# Patient Record
Sex: Male | Born: 1960
Health system: Southern US, Community
[De-identification: ages and names within clinical notes are randomized; demographics above are authoritative.]

## PROBLEM LIST (undated history)

## (undated) DIAGNOSIS — T7840XA Allergy, unspecified, initial encounter: Secondary | ICD-10-CM

## (undated) DIAGNOSIS — Z86010 Personal history of colonic polyps: Secondary | ICD-10-CM

## (undated) DIAGNOSIS — L719 Rosacea, unspecified: Secondary | ICD-10-CM

## (undated) HISTORY — DX: Personal history of colonic polyps: Z86.010

## (undated) HISTORY — DX: Rosacea, unspecified: L71.9

## (undated) HISTORY — PX: KNEE ARTHROSCOPY: SUR90

## (undated) HISTORY — DX: Allergy, unspecified, initial encounter: T78.40XA

## (undated) HISTORY — PX: COLONOSCOPY: SHX174

---

## 2006-07-29 ENCOUNTER — Ambulatory Visit: Payer: Self-pay | Admitting: Internal Medicine

## 2006-07-29 LAB — CONVERTED CEMR LAB
ALT: 38 units/L (ref 0–40)
AST: 28 units/L (ref 0–37)
Albumin: 4.2 g/dL (ref 3.5–5.2)
Alkaline Phosphatase: 51 units/L (ref 39–117)
BUN: 11 mg/dL (ref 6–23)
Basophils Absolute: 0 10*3/uL (ref 0.0–0.1)
Basophils Relative: 0.4 % (ref 0.0–1.0)
CO2: 33 meq/L — ABNORMAL HIGH (ref 19–32)
Calcium: 9.4 mg/dL (ref 8.4–10.5)
Chloride: 101 meq/L (ref 96–112)
Chol/HDL Ratio, serum: 3.5
Cholesterol: 148 mg/dL (ref 0–200)
Creatinine, Ser: 1.4 mg/dL (ref 0.4–1.5)
Eosinophil percent: 4.8 % (ref 0.0–5.0)
GFR calc non Af Amer: 59 mL/min
Glomerular Filtration Rate, Af Am: 71 mL/min/{1.73_m2}
Glucose, Bld: 84 mg/dL (ref 70–99)
HCT: 46 % (ref 39.0–52.0)
HDL: 42.2 mg/dL (ref >39.0)
Hemoglobin: 15.5 g/dL (ref 13.0–17.0)
LDL Cholesterol: 96 mg/dL (ref 0–99)
Lymphocytes Relative: 26.2 % (ref 12.0–46.0)
MCHC: 33.7 g/dL (ref 30.0–36.0)
MCV: 94.1 fL (ref 78.0–100.0)
Monocytes Absolute: 0.4 10*3/uL (ref 0.2–0.7)
Monocytes Relative: 10.4 % (ref 3.0–11.0)
Neutro Abs: 1.9 10*3/uL (ref 1.4–7.7)
Neutrophils Relative %: 58.2 % (ref 43.0–77.0)
Platelets: 124 10*3/uL — ABNORMAL LOW (ref 150–400)
Potassium: 3.9 meq/L (ref 3.5–5.1)
RBC: 4.89 M/uL (ref 4.22–5.81)
RDW: 12.6 % (ref 11.5–14.6)
Sodium: 140 meq/L (ref 135–145)
TSH: 2.05 microintl units/mL (ref 0.35–5.50)
Total Bilirubin: 1.3 mg/dL — ABNORMAL HIGH (ref 0.3–1.2)
Total Protein: 6.4 g/dL (ref 6.0–8.3)
Triglyceride fasting, serum: 48 mg/dL (ref 0–149)
VLDL: 10 mg/dL (ref 0–40)
WBC: 3.4 10*3/uL — ABNORMAL LOW (ref 4.5–10.5)

## 2006-08-03 ENCOUNTER — Ambulatory Visit: Payer: Self-pay | Admitting: Internal Medicine

## 2006-08-03 DIAGNOSIS — L259 Unspecified contact dermatitis, unspecified cause: Secondary | ICD-10-CM | POA: Insufficient documentation

## 2006-09-23 ENCOUNTER — Ambulatory Visit: Payer: Self-pay | Admitting: Internal Medicine

## 2006-11-03 ENCOUNTER — Ambulatory Visit: Payer: Self-pay | Admitting: Internal Medicine

## 2006-11-03 LAB — CONVERTED CEMR LAB
Basophils Absolute: 0 10*3/uL (ref 0.0–0.1)
Basophils Relative: 0.3 % (ref 0.0–1.0)
Eosinophils Absolute: 0.2 10*3/uL (ref 0.0–0.6)
Eosinophils Relative: 5.1 % — ABNORMAL HIGH (ref 0.0–5.0)
HCT: 43.4 % (ref 39.0–52.0)
Hemoglobin: 15.3 g/dL (ref 13.0–17.0)
Lymphocytes Relative: 25.8 % (ref 12.0–46.0)
MCHC: 35.2 g/dL (ref 30.0–36.0)
MCV: 93 fL (ref 78.0–100.0)
Monocytes Absolute: 0.4 10*3/uL (ref 0.2–0.7)
Monocytes Relative: 11.8 % — ABNORMAL HIGH (ref 3.0–11.0)
Neutro Abs: 2.1 10*3/uL (ref 1.4–7.7)
Neutrophils Relative %: 57 % (ref 43.0–77.0)
Platelets: 136 10*3/uL — ABNORMAL LOW (ref 150–400)
RBC: 4.66 M/uL (ref 4.22–5.81)
RDW: 13.1 % (ref 11.5–14.6)
WBC: 3.7 10*3/uL — ABNORMAL LOW (ref 4.5–10.5)

## 2006-12-05 ENCOUNTER — Emergency Department (HOSPITAL_COMMUNITY): Admission: EM | Admit: 2006-12-05 | Discharge: 2006-12-06 | Payer: Self-pay | Admitting: Emergency Medicine

## 2006-12-06 ENCOUNTER — Ambulatory Visit: Payer: Self-pay | Admitting: Vascular Surgery

## 2006-12-06 ENCOUNTER — Encounter: Payer: Self-pay | Admitting: Vascular Surgery

## 2006-12-06 ENCOUNTER — Ambulatory Visit (HOSPITAL_COMMUNITY): Admission: RE | Admit: 2006-12-06 | Discharge: 2006-12-06 | Payer: Self-pay | Admitting: Emergency Medicine

## 2007-05-26 DIAGNOSIS — J309 Allergic rhinitis, unspecified: Secondary | ICD-10-CM | POA: Insufficient documentation

## 2007-05-31 ENCOUNTER — Ambulatory Visit: Payer: Self-pay | Admitting: Internal Medicine

## 2007-06-01 ENCOUNTER — Telehealth (INDEPENDENT_AMBULATORY_CARE_PROVIDER_SITE_OTHER): Payer: Self-pay | Admitting: *Deleted

## 2007-06-06 ENCOUNTER — Encounter: Payer: Self-pay | Admitting: Internal Medicine

## 2007-10-31 ENCOUNTER — Encounter: Payer: Self-pay | Admitting: Internal Medicine

## 2007-11-02 ENCOUNTER — Ambulatory Visit: Payer: Self-pay | Admitting: Internal Medicine

## 2007-11-02 DIAGNOSIS — L719 Rosacea, unspecified: Secondary | ICD-10-CM | POA: Insufficient documentation

## 2008-11-09 ENCOUNTER — Telehealth: Payer: Self-pay | Admitting: Internal Medicine

## 2009-04-11 ENCOUNTER — Telehealth: Payer: Self-pay | Admitting: Internal Medicine

## 2009-04-18 ENCOUNTER — Telehealth: Payer: Self-pay | Admitting: Internal Medicine

## 2009-05-03 ENCOUNTER — Ambulatory Visit: Payer: Self-pay | Admitting: Internal Medicine

## 2009-05-03 LAB — CONVERTED CEMR LAB
AST: 22 units/L (ref 0–37)
Albumin: 4 g/dL (ref 3.5–5.2)
Alkaline Phosphatase: 54 units/L (ref 39–117)
BUN: 11 mg/dL (ref 6–23)
Basophils Absolute: 0 10*3/uL (ref 0.0–0.1)
Basophils Relative: 0.1 % (ref 0.0–3.0)
Bilirubin, Direct: 0.2 mg/dL (ref 0.0–0.3)
CO2: 32 meq/L (ref 19–32)
Chloride: 104 meq/L (ref 96–112)
Creatinine, Ser: 1.1 mg/dL (ref 0.4–1.5)
Eosinophils Absolute: 0.2 10*3/uL (ref 0.0–0.7)
Glucose, Bld: 100 mg/dL — ABNORMAL HIGH (ref 70–99)
Hemoglobin: 16.2 g/dL (ref 13.0–17.0)
Ketones, ur: NEGATIVE mg/dL
LDL Cholesterol: 85 mg/dL (ref 0–99)
Lymphocytes Relative: 15.1 % (ref 12.0–46.0)
Monocytes Relative: 11.2 % (ref 3.0–12.0)
Neutro Abs: 3.7 10*3/uL (ref 1.4–7.7)
Neutrophils Relative %: 70.4 % (ref 43.0–77.0)
Potassium: 4.4 meq/L (ref 3.5–5.1)
RBC: 4.92 M/uL (ref 4.22–5.81)
Specific Gravity, Urine: 1.015 (ref 1.000–1.030)
Total CHOL/HDL Ratio: 3
Total Protein, Urine: NEGATIVE mg/dL
Total Protein: 7 g/dL (ref 6.0–8.3)
Urine Glucose: NEGATIVE mg/dL
Urobilinogen, UA: 0.2 (ref 0.0–1.0)
VLDL: 10.2 mg/dL (ref 0.0–40.0)

## 2009-05-16 ENCOUNTER — Ambulatory Visit: Payer: Self-pay | Admitting: Internal Medicine

## 2010-06-11 ENCOUNTER — Ambulatory Visit: Payer: Self-pay | Admitting: Internal Medicine

## 2010-06-11 LAB — CONVERTED CEMR LAB
AST: 22 units/L (ref 0–37)
Albumin: 4 g/dL (ref 3.5–5.2)
BUN: 14 mg/dL (ref 6–23)
Basophils Absolute: 0 10*3/uL (ref 0.0–0.1)
Bilirubin Urine: NEGATIVE
CO2: 33 meq/L — ABNORMAL HIGH (ref 19–32)
Chloride: 104 meq/L (ref 96–112)
Cholesterol: 141 mg/dL (ref 0–200)
Eosinophils Absolute: 0.1 10*3/uL (ref 0.0–0.7)
Glucose, Bld: 77 mg/dL (ref 70–99)
HCT: 43.1 % (ref 39.0–52.0)
HDL: 40.7 mg/dL (ref >39.00)
Hemoglobin: 15 g/dL (ref 13.0–17.0)
Lymphs Abs: 0.8 10*3/uL (ref 0.7–4.0)
MCHC: 34.8 g/dL (ref 30.0–36.0)
MCV: 95.3 fL (ref 78.0–100.0)
Monocytes Absolute: 0.3 10*3/uL (ref 0.1–1.0)
Monocytes Relative: 9.6 % (ref 3.0–12.0)
Neutro Abs: 1.9 10*3/uL (ref 1.4–7.7)
Nitrite: NEGATIVE
Platelets: 109 10*3/uL — ABNORMAL LOW (ref 150.0–400.0)
Potassium: 4.4 meq/L (ref 3.5–5.1)
RDW: 13.5 % (ref 11.5–14.6)
Sodium: 141 meq/L (ref 135–145)
TSH: 2.34 microintl units/mL (ref 0.35–5.50)
Total Bilirubin: 0.7 mg/dL (ref 0.3–1.2)
Total Protein, Urine: NEGATIVE mg/dL
Urine Glucose: NEGATIVE mg/dL
pH: 6 (ref 5.0–8.0)

## 2010-06-18 ENCOUNTER — Ambulatory Visit: Payer: Self-pay | Admitting: Internal Medicine

## 2010-06-18 DIAGNOSIS — D696 Thrombocytopenia, unspecified: Secondary | ICD-10-CM | POA: Insufficient documentation

## 2010-06-18 DIAGNOSIS — D72819 Decreased white blood cell count, unspecified: Secondary | ICD-10-CM | POA: Insufficient documentation

## 2010-06-18 HISTORY — DX: Thrombocytopenia, unspecified: D69.6

## 2010-07-28 ENCOUNTER — Encounter: Payer: Self-pay | Admitting: Internal Medicine

## 2010-09-23 NOTE — Assessment & Plan Note (Signed)
Summary: CPX // RS   Vital Signs:  Patient profile:   50 year old male Height:      70.5 inches Weight:      177 pounds BMI:     25.13 Temp:     97.8 degrees F oral Pulse rate:   64 / minute Pulse rhythm:   regular BP sitting:   116 / 80  (right arm) Cuff size:   regular  Vitals Entered By: Alfred Levins, CMA (June 18, 2010 8:18 AM) CC: cpx   CC:  cpx.  History of Present Illness: cpx  Current Problems (verified): 1)  Preventive Health Care  (ICD-V70.0) 2)  Rosacea  (ICD-695.3) 3)  Allergic Rhinitis  (ICD-477.9) 4)  Dermatitis, Scalp  (ICD-692.9)  Current Medications (verified): 1)  Fexofenadine Hcl 180 Mg  Tabs (Fexofenadine Hcl) .... Take 1 Tablet By Mouth Once A Day 2)  Bi-Focal Magnifier   Misc (Misc. Devices) 3)  Fluticasone Propionate 50 Mcg/act  Susp (Fluticasone Propionate) .... Once Daily As Needed 4)  Zolpidem Tartrate 5 Mg Tabs (Zolpidem Tartrate) .... Take 1 Tablet By Mouth At Bedtime As Needed --Not To Exceed Two Per Week.  Allergies (verified): No Known Drug Allergies  Past History:  Past Medical History: Last updated: 05/26/2007 Allergic rhinitis  Past Surgical History: Last updated: 08/03/2006 knee arthroscopy  Family History: Last updated: 08/03/2006 father aortic valve disease--replaced mother alive and well  Social History: Last updated: 08/03/2006 Occupation: [employer] Married Never Smoked Alcohol use-yes Regular exercise-yes soccer  Risk Factors: Exercise: yes (08/03/2006)  Risk Factors: Smoking Status: never (08/03/2006)  Physical Exam  Nose:  no external erythema.     Impression & Recommendations:  Problem # 1:  PREVENTIVE HEALTH CARE (ICD-V70.0) health maint UTD continue good exercise habits.   Problem # 2:  ALLERGIC RHINITIS (ICD-477.9) doing well on current meds His updated medication list for this problem includes:    Fexofenadine Hcl 180 Mg Tabs (Fexofenadine hcl) .[name] Kitchen... Take 1 tablet by mouth once a day  Fluticasone Propionate 50 Mcg/act Susp (Fluticasone propionate) ..... Once daily as needed  Problem # 3:  LEUKOPENIA, MILD (ICD-288.50) chronic. no need for workup  Problem # 4:  THROMBOCYTOPENIA (ICD-287.5)  Complete Medication List: 1)  Fexofenadine Hcl 180 Mg Tabs (Fexofenadine hcl) .... Take 1 tablet by mouth once a day 2)  Bi-focal Magnifier Misc (Misc. devices) 3)  Fluticasone Propionate 50 Mcg/act Susp (Fluticasone propionate) .... Once daily as needed 4)  Zolpidem Tartrate 5 Mg Tabs (Zolpidem tartrate) .... Take 1 tablet by mouth at bedtime as needed --not to exceed two per week.   Orders Added: 1)  Est. Patient 40-64 years [[id number]]   Physical Exam General Appearance: well developed, well nourished, no acute distress Eyes: conjunctiva and lids normal, PERRL, EOMI, Ears, Nose, Mouth, Throat: TM clear, nares clear, oral exam WNL Neck: supple, no lymphadenopathy, no thyromegaly, no JVD Respiratory: clear to auscultation and percussion, respiratory effort normal Cardiovascular: regular rate and rhythm, S1-S2, no murmur, rub or gallop, no bruits, peripheral pulses normal and symmetric, no cyanosis, clubbing, edema or varicosities Chest: no scars, masses, tenderness; no asymmetry, skin changes, nipple discharge, no gynecomastia   Gastrointestinal: soft, non-tender; no hepatosplenomegaly, masses; active bowel sounds all quadrants,  no masses, tenderness, hemorrhoids  Genitourinary: no hernia, or prostate enlargement Lymphatic: no cervical, axillary or inguinal adenopathy Musculoskeletal: gait normal, muscle tone and strength WNL, no joint swelling, effusions, discoloration, crepitus  Skin: clear, good turgor, color WNL, no rashes, lesions, or ulcerations Neurologic:  normal mental status, normal reflexes, normal strength, sensation, and motion Psychiatric: alert; oriented to person, place and time Other Exam:

## 2010-09-25 NOTE — Letter (Signed)
Summary: [facility]  [facility]   Imported By: Maryln Gottron 09/02/2010 09:15:38  _____________________________________________________________________  External Attachment:    Type:   Image     Comment:   External Document

## 2011-07-10 ENCOUNTER — Other Ambulatory Visit: Payer: Self-pay

## 2011-07-10 ENCOUNTER — Encounter: Payer: Self-pay | Admitting: Internal Medicine

## 2011-07-15 ENCOUNTER — Ambulatory Visit (INDEPENDENT_AMBULATORY_CARE_PROVIDER_SITE_OTHER): Payer: BC Managed Care – PPO | Admitting: Internal Medicine

## 2011-07-15 ENCOUNTER — Encounter: Payer: Self-pay | Admitting: Internal Medicine

## 2011-07-15 VITALS — BP 102/76 | HR 68 | Temp 98.2°F | Ht 69.0 in | Wt 171.0 lb

## 2011-07-15 DIAGNOSIS — Z0000 Encounter for general adult medical examination without abnormal findings: Secondary | ICD-10-CM

## 2011-07-15 LAB — LIPID PANEL
Cholesterol: 143 mg/dL (ref 0–200)
HDL: 47.3 mg/dL (ref >39.00)
LDL Cholesterol: 85 mg/dL (ref 0–99)
VLDL: 10.4 mg/dL (ref 0.0–40.0)

## 2011-07-15 LAB — CBC WITH DIFFERENTIAL/PLATELET
Basophils Relative: 0.5 % (ref 0.0–3.0)
Eosinophils Relative: 3.5 % (ref 0.0–5.0)
HCT: 46.4 % (ref 39.0–52.0)
Lymphs Abs: 1 10*3/uL (ref 0.7–4.0)
MCV: 94.9 fl (ref 78.0–100.0)
Monocytes Relative: 16.9 % — ABNORMAL HIGH (ref 3.0–12.0)
Neutrophils Relative %: 43.8 % (ref 43.0–77.0)
Platelets: 113 10*3/uL — ABNORMAL LOW (ref 150.0–400.0)
RBC: 4.89 Mil/uL (ref 4.22–5.81)
WBC: 2.7 10*3/uL — ABNORMAL LOW (ref 4.5–10.5)

## 2011-07-15 LAB — BASIC METABOLIC PANEL
CO2: 30 mEq/L (ref 19–32)
Chloride: 104 mEq/L (ref 96–112)
Creatinine, Ser: 1.1 mg/dL (ref 0.4–1.5)
Glucose, Bld: 70 mg/dL (ref 70–99)
Sodium: 141 mEq/L (ref 135–145)

## 2011-07-15 LAB — POCT URINALYSIS DIPSTICK
Glucose, UA: NEGATIVE
Leukocytes, UA: NEGATIVE
Nitrite, UA: NEGATIVE
Protein, UA: NEGATIVE
Urobilinogen, UA: 0.2

## 2011-07-15 LAB — HEPATIC FUNCTION PANEL
ALT: 21 U/L (ref 0–53)
Total Protein: 7.3 g/dL (ref 6.0–8.3)

## 2011-07-15 MED ORDER — FLUOCINOLONE ACETONIDE 0.01 % EX OIL: TOPICAL_OIL | CUTANEOUS | Status: DC | PRN

## 2011-07-15 MED ORDER — FLUTICASONE PROPIONATE 50 MCG/ACT NA SUSP: 2.0000 | Freq: Every day | NASAL | Status: DC

## 2011-07-15 MED ORDER — FEXOFENADINE HCL 180 MG PO TABS: 180.0000 mg | ORAL_TABLET | Freq: Every day | ORAL | Status: DC

## 2011-07-15 MED ORDER — ZOLPIDEM TARTRATE 5 MG PO TABS: 5.0000 mg | ORAL_TABLET | Freq: Every evening | ORAL | Status: DC | PRN

## 2011-07-15 NOTE — Progress Notes (Signed)
Addended by: Alfred Levins D on: 07/15/2011 10:25 AM   Modules accepted: Orders

## 2011-07-15 NOTE — Progress Notes (Signed)
cpx  Past Medical History  Diagnosis Date  . Allergy     History   Social History  . Marital Status: Married    Spouse Name: N/A    Number of Children: N/A  . Years of Education: N/A   Occupational History  . Not on file.   Social History Main Topics  . Smoking status: Never Smoker   . Smokeless tobacco: Not on file  . Alcohol Use: Yes  . Drug Use:   . Sexually Active:    Other Topics Concern  . Not on file   Social History Narrative  . No narrative on file    Past Surgical History  Procedure Date  . Knee arthroscopy     Family History  Problem Relation Age of Onset  . Heart disease Father     aortic vavle disease (replaced)    No Known Allergies  Current Outpatient Prescriptions on File Prior to Visit  Medication Sig Dispense Refill  . fexofenadine (ALLEGRA) 180 MG tablet Take 180 mg by mouth daily.        . fluticasone (FLONASE) 50 MCG/ACT nasal spray Place 2 sprays into the nose daily.        [name] Kitchen zolpidem (AMBIEN) 5 MG tablet Take 5 mg by mouth at bedtime as needed.           patient denies chest pain, shortness of breath, orthopnea. Denies lower extremity edema, abdominal pain, change in appetite, change in bowel movements. Patient denies rashes, musculoskeletal complaints. No other specific complaints in a complete review of systems.   BP 102/76  Pulse 68  Temp(Src) 98.2 F (36.8 C) (Oral)  Ht 5\' 9"  (1.753 m)  Wt 171 lb (77.565 kg)  BMI 25.25 kg/m2   well-developed well-nourished male in no acute distress. HEENT exam atraumatic, normocephalic, neck supple without jugular venous distention. Chest clear to auscultation cardiac exam S1-S2 are regular. Abdominal exam overweight with bowel sounds, soft and nontender. Extremities no edema. Neurologic exam is alert with a normal gait. Discussed that prostate exam not necessary   A/P Well visit-health maint UTD Well visit--health maint utd

## 2011-07-17 LAB — TSH: TSH: 0.89 u[IU]/mL (ref 0.35–5.50)

## 2011-07-27 ENCOUNTER — Telehealth: Payer: Self-pay | Admitting: Internal Medicine

## 2011-07-27 NOTE — Telephone Encounter (Signed)
Pt called and is req to get a copy of his lab results mailed to pts home address. Pt is also needing copy of doctors note from cpx exam sent to his home also. Pt needs to get proof that he had a cpx done.

## 2011-07-28 NOTE — Telephone Encounter (Signed)
Labs mailed, pt will call back to discuss office note

## 2011-08-30 ENCOUNTER — Other Ambulatory Visit: Payer: Self-pay | Admitting: Internal Medicine

## 2012-07-08 ENCOUNTER — Other Ambulatory Visit: Payer: BC Managed Care – PPO

## 2012-07-11 ENCOUNTER — Other Ambulatory Visit (INDEPENDENT_AMBULATORY_CARE_PROVIDER_SITE_OTHER): Payer: BC Managed Care – PPO

## 2012-07-11 DIAGNOSIS — Z0000 Encounter for general adult medical examination without abnormal findings: Secondary | ICD-10-CM

## 2012-07-11 LAB — HEPATIC FUNCTION PANEL
ALT: 17 U/L (ref 0–53)
AST: 24 U/L (ref 0–37)
Bilirubin, Direct: 0.2 mg/dL (ref 0.0–0.3)
Total Bilirubin: 1.1 mg/dL (ref 0.3–1.2)

## 2012-07-11 LAB — POCT URINALYSIS DIPSTICK
Bilirubin, UA: NEGATIVE
Blood, UA: NEGATIVE
pH, UA: 6

## 2012-07-11 LAB — BASIC METABOLIC PANEL
BUN: 19 mg/dL (ref 6–23)
GFR: 64.41 mL/min (ref >60.00)
Potassium: 4 mEq/L (ref 3.5–5.1)
Sodium: 139 mEq/L (ref 135–145)

## 2012-07-11 LAB — CBC WITH DIFFERENTIAL/PLATELET
Basophils Relative: 0.7 % (ref 0.0–3.0)
Eosinophils Relative: 4.8 % (ref 0.0–5.0)
HCT: 41.7 % (ref 39.0–52.0)
Lymphs Abs: 1 10*3/uL (ref 0.7–4.0)
Monocytes Relative: 10.1 % (ref 3.0–12.0)
Neutrophils Relative %: 56.1 % (ref 43.0–77.0)
Platelets: 106 10*3/uL — ABNORMAL LOW (ref 150.0–400.0)
RBC: 4.41 Mil/uL (ref 4.22–5.81)
WBC: 3.4 10*3/uL — ABNORMAL LOW (ref 4.5–10.5)

## 2012-07-11 LAB — LIPID PANEL
HDL: 46.5 mg/dL (ref >39.00)
LDL Cholesterol: 91 mg/dL (ref 0–99)
Total CHOL/HDL Ratio: 3
VLDL: 6.4 mg/dL (ref 0.0–40.0)

## 2012-07-15 ENCOUNTER — Encounter: Payer: BC Managed Care – PPO | Admitting: Internal Medicine

## 2012-07-29 ENCOUNTER — Encounter: Payer: Self-pay | Admitting: Internal Medicine

## 2012-07-29 ENCOUNTER — Ambulatory Visit (INDEPENDENT_AMBULATORY_CARE_PROVIDER_SITE_OTHER): Payer: BC Managed Care – PPO | Admitting: Internal Medicine

## 2012-07-29 VITALS — BP 102/74 | HR 68 | Temp 97.9°F | Ht 70.0 in | Wt 176.0 lb

## 2012-07-29 DIAGNOSIS — Z0000 Encounter for general adult medical examination without abnormal findings: Secondary | ICD-10-CM

## 2012-07-30 NOTE — Progress Notes (Signed)
Patient ID: Cody Knapp, male   DOB: 08/30/1960, 51 y.o.   MRN: [id number] cpx  Past Medical History  Diagnosis Date  . Allergy     History   Social History  . Marital Status: Married    Spouse Name: N/A    Number of Children: N/A  . Years of Education: N/A   Occupational History  . Not on file.   Social History Main Topics  . Smoking status: Never Smoker   . Smokeless tobacco: Not on file  . Alcohol Use: Yes  . Drug Use:   . Sexually Active:    Other Topics Concern  . Not on file   Social History Narrative  . No narrative on file    Past Surgical History  Procedure Date  . Knee arthroscopy     Family History  Problem Relation Age of Onset  . Heart disease Father     aortic vavle disease (replaced)    No Known Allergies  Current Outpatient Prescriptions on File Prior to Visit  Medication Sig Dispense Refill  . fexofenadine (ALLEGRA) 180 MG tablet Take 1 tablet (180 mg total) by mouth daily.  30 tablet  11  . fluocinolone (FLUOCINOLONE ACETONIDE SCALP) 0.01 % external oil Apply topically as needed.  120 mL  5  . fluticasone (FLONASE) 50 MCG/ACT nasal spray Place 2 sprays into the nose daily.  16 g  11  . zolpidem (AMBIEN) 5 MG tablet TAKE 1 TABLET BY MOUTH AT BEDTIME AS NEEDED. DO NOT EXCEED 2 TABLETS PER WEEK  20 tablet  2     patient denies chest pain, shortness of breath, orthopnea. Denies lower extremity edema, abdominal pain, change in appetite, change in bowel movements. Patient denies rashes, musculoskeletal complaints. No other specific complaints in a complete review of systems.   BP 102/74  Pulse 68  Temp 97.9 F (36.6 C) (Oral)  Ht 5\' 10"  (1.778 m)  Wt 176 lb (79.833 kg)  BMI 25.25 kg/m2  well-developed well-nourished male in no acute distress. HEENT exam atraumatic, normocephalic, neck supple without jugular venous distention. Chest clear to auscultation cardiac exam S1-S2 are regular. Abdominal exam overweight with bowel sounds, soft and  nontender. Extremities no edema. Neurologic exam is alert with a normal gait.  A/P- well visit- health maint utd

## 2013-01-10 ENCOUNTER — Encounter: Payer: Self-pay | Admitting: Family Medicine

## 2013-01-10 ENCOUNTER — Ambulatory Visit (INDEPENDENT_AMBULATORY_CARE_PROVIDER_SITE_OTHER): Payer: BC Managed Care – PPO | Admitting: Family Medicine

## 2013-01-10 VITALS — BP 118/82 | Temp 98.0°F | Wt 179.0 lb

## 2013-01-10 DIAGNOSIS — B029 Zoster without complications: Secondary | ICD-10-CM

## 2013-01-10 NOTE — Progress Notes (Signed)
Chief Complaint  Patient presents with  . Abdominal Pain    dx with shingles on Friday; stomach is tender    HPI:  Acute visit for shingles - tenderness in dermatomal pattern: -tenderness in R back area 4 days ago then broke out in vesicular rash - seen the next day and tx with valtrex - at the time had also developed tenderness wrapping around to front -continued pain in dermatomal pattern and wanted to see if needed longer course of valtrex -denies: fevers, chills, nausea, vomiting, bowel changes -doe snot want HIV testing - not high risk and not immunocompirmised  ROS: See pertinent positives and negatives per HPI.  Past Medical History  Diagnosis Date  . Allergy     Family History  Problem Relation Age of Onset  . Heart disease Father     aortic vavle disease (replaced)    History   Social History  . Marital Status: Married    Spouse Name: N/A    Number of Children: N/A  . Years of Education: N/A   Social History Main Topics  . Smoking status: Never Smoker   . Smokeless tobacco: None  . Alcohol Use: Yes  . Drug Use:   . Sexually Active:    Other Topics Concern  . None   Social History Narrative  . None    Current outpatient prescriptions:cloNIDine (CATAPRES) 0.1 MG tablet, Take 0.1 mg by mouth at bedtime., Disp: , Rfl: ;  doxycycline (ORACEA) 40 MG capsule, Take 40 mg by mouth every morning., Disp: , Rfl: ;  fexofenadine (ALLEGRA) 180 MG tablet, Take 1 tablet (180 mg total) by mouth daily., Disp: 30 tablet, Rfl: 11;  fluocinolone (FLUOCINOLONE ACETONIDE SCALP) 0.01 % external oil, Apply topically as needed., Disp: 120 mL, Rfl: 5 fluticasone (FLONASE) 50 MCG/ACT nasal spray, Place 2 sprays into the nose daily., Disp: 16 g, Rfl: 11;  valACYclovir (VALTREX) 1000 MG tablet, Take 1,000 mg by mouth every 8 (eight) hours., Disp: , Rfl: ;  zolpidem (AMBIEN) 5 MG tablet, TAKE 1 TABLET BY MOUTH AT BEDTIME AS NEEDED. DO NOT EXCEED 2 TABLETS PER WEEK, Disp: 20 tablet, Rfl:  2  EXAM:  Filed Vitals:   01/10/13 1611  BP: 118/82  Temp: 98 F (36.7 C)    Body mass index is 25.68 kg/(m^2).  GENERAL: vitals reviewed and listed above, alert, oriented, appears well hydrated and in no acute distress  HEENT: atraumatic, conjunttiva clear, no obvious abnormalities on inspection of external nose and ears  NECK: no obvious masses on inspection  LUNGS: clear to auscultation bilaterally, no wheezes, rales or rhonchi, good air movement  CV: HRRR, no peripheral edema  ABD: BS+, soft, NTTP  SKI: papulovesicular rasch R back and flank in dermatomal pattern, no signs secondary infection  MS: moves all extremities without noticeable abnormality  PSYCH: pleasant and cooperative, no obvious depression or anxiety  ASSESSMENT AND PLAN:  Discussed the following assessment and plan:  Shingles  -mild skin findings, on appropriate tx, discussed course/implications/return precautions -Patient advised to return or notify a doctor immediately if symptoms worsen or persist or new concerns arise.  There are no Patient Instructions on file for this visit.   Kriste Basque R.

## 2013-06-16 ENCOUNTER — Encounter: Payer: Self-pay | Admitting: Internal Medicine

## 2013-07-28 ENCOUNTER — Ambulatory Visit (AMBULATORY_SURGERY_CENTER): Payer: Self-pay

## 2013-07-28 ENCOUNTER — Encounter: Payer: Self-pay | Admitting: Internal Medicine

## 2013-07-28 VITALS — Ht 70.0 in | Wt 170.0 lb

## 2013-07-28 DIAGNOSIS — Z1211 Encounter for screening for malignant neoplasm of colon: Secondary | ICD-10-CM

## 2013-07-28 MED ORDER — NA SULFATE-K SULFATE-MG SULF 17.5-3.13-1.6 GM/177ML PO SOLN: 1.0000 | Freq: Once | ORAL | Status: DC

## 2013-08-10 ENCOUNTER — Ambulatory Visit (AMBULATORY_SURGERY_CENTER): Payer: BC Managed Care – PPO | Admitting: Internal Medicine

## 2013-08-10 ENCOUNTER — Encounter: Payer: Self-pay | Admitting: Internal Medicine

## 2013-08-10 VITALS — BP 110/76 | HR 70 | Temp 98.0°F | Resp 19 | Ht 70.0 in | Wt 170.0 lb

## 2013-08-10 DIAGNOSIS — Z860100 Personal history of colon polyps, unspecified: Secondary | ICD-10-CM

## 2013-08-10 DIAGNOSIS — D126 Benign neoplasm of colon, unspecified: Secondary | ICD-10-CM

## 2013-08-10 DIAGNOSIS — Z1211 Encounter for screening for malignant neoplasm of colon: Secondary | ICD-10-CM

## 2013-08-10 HISTORY — DX: Personal history of colon polyps, unspecified: Z86.0100

## 2013-08-10 HISTORY — DX: Personal history of colonic polyps: Z86.010

## 2013-08-10 MED ORDER — SODIUM CHLORIDE 0.9 % IV SOLN: 500.0000 mL | INTRAVENOUS | Status: DC

## 2013-08-10 NOTE — Progress Notes (Signed)
Called to room to assist during endoscopic procedure.  Patient ID and intended procedure confirmed with present staff. Received instructions for my participation in the procedure from the performing physician.  

## 2013-08-10 NOTE — Patient Instructions (Addendum)
I found and removed one small polyp that looked benign. Everything else was ok with a good prep.  I will let you know pathology results and when to have another routine colonoscopy by mail.  I appreciate the opportunity to care for you. Iva Boop, MD, FACG  YOU HAD AN ENDOSCOPIC PROCEDURE TODAY AT THE [facility]: Refer to the procedure report that was given to you for any specific questions about what was found during the examination.  If the procedure report does not answer your questions, please call your gastroenterologist to clarify.  If you requested that your care partner not be given the details of your procedure findings, then the procedure report has been included in a sealed envelope for you to review at your convenience later.  YOU SHOULD EXPECT: Some feelings of bloating in the abdomen. Passage of more gas than usual.  Walking can help get rid of the air that was put into your GI tract during the procedure and reduce the bloating. If you had a lower endoscopy (such as a colonoscopy or flexible sigmoidoscopy) you may notice spotting of blood in your stool or on the toilet paper. If you underwent a bowel prep for your procedure, then you may not have a normal bowel movement for a few days.  DIET: Your first meal following the procedure should be a light meal and then it is ok to progress to your normal diet.  A half-sandwich or bowl of soup is an example of a good first meal.  Heavy or fried foods are harder to digest and may make you feel nauseous or bloated.  Likewise meals heavy in dairy and vegetables can cause extra gas to form and this can also increase the bloating.  Drink plenty of fluids but you should avoid alcoholic beverages for 24 hours.  ACTIVITY: Your care partner should take you home directly after the procedure.  You should plan to take it easy, moving slowly for the rest of the day.  You can resume normal activity the day after the procedure however you  should NOT DRIVE or use heavy machinery for 24 hours (because of the sedation medicines used during the test).    SYMPTOMS TO REPORT IMMEDIATELY: A gastroenterologist can be reached at any hour.  During normal business hours, 8:30 AM to 5:00 PM Monday through Friday, call [phone].  After hours and on weekends, please call the GI answering service at [phone] who will take a message and have the physician on call contact you.   Following lower endoscopy (colonoscopy or flexible sigmoidoscopy):  Excessive amounts of blood in the stool  Significant tenderness or worsening of abdominal pains  Swelling of the abdomen that is new, acute  Fever of 100F or higher    FOLLOW UP: If any biopsies were taken you will be contacted by phone or by letter within the next 1-3 weeks.  Call your gastroenterologist if you have not heard about the biopsies in 3 weeks.  Our staff will call the home number listed on your records the next business day following your procedure to check on you and address any questions or concerns that you may have at that time regarding the information given to you following your procedure. This is a courtesy call and so if there is no answer at the home number and we have not heard from you through the emergency physician on call, we will assume that you have returned to your regular daily activities  without incident.  SIGNATURES/CONFIDENTIALITY: You and/or your care partner have signed paperwork which will be entered into your electronic medical record.  These signatures attest to the fact that that the information above on your After Visit Summary has been reviewed and is understood.  Full responsibility of the confidentiality of this discharge information lies with you and/or your care-partner.  Polyp information given.

## 2013-08-10 NOTE — Progress Notes (Signed)
Lidocaine-40mg IV prior to Propofol InductionPropofol given over incremental dosages 

## 2013-08-10 NOTE — Op Note (Signed)
[facility] [address] [place], [address]   COLONOSCOPY PROCEDURE REPORT  PATIENT: Cody, Knapp  MR#: [id number] BIRTHDATE: [birth year] , 51  yrs. old GENDER: Male ENDOSCOPIST: Iva Boop, MD, [facility] REFERRED XB:JYNWG Swords, M.D. PROCEDURE DATE:  08/10/2013 PROCEDURE:   Colonoscopy with snare polypectomy First Screening Colonoscopy - Avg.  risk and is 50 yrs.  old or older Yes.  Prior Negative Screening - Now for repeat screening. N/A  History of Adenoma - Now for follow-up colonoscopy & has been > or = to 3 yrs.  N/A  Polyps Removed Today? Yes. ASA CLASS:   Class II INDICATIONS:average risk screening and first colonoscopy. MEDICATIONS: propofol (Diprivan) 400mg  IV, MAC sedation, administered by CRNA, and These medications were titrated to patient response per physician's verbal order  DESCRIPTION OF PROCEDURE:   After the risks benefits and alternatives of the procedure were thoroughly explained, informed consent was obtained.  A digital rectal exam revealed no abnormalities of the rectum, A digital rectal exam revealed the prostate was not enlarged, and A digital rectal exam revealed no prostatic nodules.   The LB [id number]  endoscope was introduced through the anus and advanced to the cecum, which was identified by both the appendix and ileocecal valve. No adverse events experienced.   The quality of the prep was Suprep good  The instrument was then slowly withdrawn as the colon was fully examined.      COLON FINDINGS: A sessile polyp measuring 6 mm in size was found at the hepatic flexure.  A polypectomy was performed with a cold snare.  The resection was complete and the polyp tissue was completely retrieved.   The colon mucosa was otherwise normal.   A right colon retroflexion was performed.  Retroflexed views revealed no abnormalities. The time to cecum=3 minutes 20 seconds. Withdrawal time=13 minutes 20 seconds.  The scope was  withdrawn and the procedure completed. COMPLICATIONS: There were no complications.  ENDOSCOPIC IMPRESSION: 1.   Sessile polyp measuring 6 mm in size was found at the hepatic flexure; polypectomy was performed with a cold snare 2.   The colon mucosa was otherwise normal - good prep  RECOMMENDATIONS: Timing of repeat colonoscopy will be determined by pathology findings.   eSigned:  Iva Boop, MD, Clementeen Graham 08/10/2013 2:07 PM   cc: Lindley Magnus, MD and The Patient

## 2013-08-11 ENCOUNTER — Telehealth: Payer: Self-pay

## 2013-08-11 NOTE — Telephone Encounter (Signed)
  Follow up Call-  Call back number 08/10/2013  Post procedure Call Back phone  # [phone]  Permission to leave phone message Yes     Patient questions:  Do you have a fever, pain , or abdominal swelling? no Pain Score  0 *  Have you tolerated food without any problems? yes  Have you been able to return to your normal activities? yes  Do you have any questions about your discharge instructions: Diet   no Medications  no Follow up visit  no  Do you have questions or concerns about your Care? no  Actions: * If pain score is 4 or above: No action needed, pain <4.

## 2013-08-20 ENCOUNTER — Encounter: Payer: Self-pay | Admitting: Internal Medicine

## 2013-08-20 NOTE — Progress Notes (Signed)
Quick Note:  6 mm sessile serrated adenoma Repeat colon 07/2018 ______

## 2013-10-13 ENCOUNTER — Other Ambulatory Visit (INDEPENDENT_AMBULATORY_CARE_PROVIDER_SITE_OTHER): Payer: BC Managed Care – PPO

## 2013-10-13 DIAGNOSIS — Z0000 Encounter for general adult medical examination without abnormal findings: Secondary | ICD-10-CM

## 2013-10-13 LAB — POCT URINALYSIS DIPSTICK
Bilirubin, UA: NEGATIVE
Glucose, UA: NEGATIVE
Ketones, UA: NEGATIVE
Leukocytes, UA: NEGATIVE
NITRITE UA: NEGATIVE
PH UA: 7
RBC UA: NEGATIVE
Spec Grav, UA: 1.02
UROBILINOGEN UA: 0.2

## 2013-10-13 LAB — CBC WITH DIFFERENTIAL/PLATELET
Basophils Absolute: 0 10*3/uL (ref 0.0–0.1)
Basophils Relative: 0.7 % (ref 0.0–3.0)
EOS PCT: 5.1 % — AB (ref 0.0–5.0)
Eosinophils Absolute: 0.2 10*3/uL (ref 0.0–0.7)
HCT: 49.7 % (ref 39.0–52.0)
HEMOGLOBIN: 16.7 g/dL (ref 13.0–17.0)
LYMPHS ABS: 0.9 10*3/uL (ref 0.7–4.0)
Lymphocytes Relative: 25.4 % (ref 12.0–46.0)
MCHC: 33.6 g/dL (ref 30.0–36.0)
MCV: 94.2 fl (ref 78.0–100.0)
MONOS PCT: 10.4 % (ref 3.0–12.0)
Monocytes Absolute: 0.4 10*3/uL (ref 0.1–1.0)
NEUTROS ABS: 2.2 10*3/uL (ref 1.4–7.7)
Neutrophils Relative %: 58.4 % (ref 43.0–77.0)
Platelets: 137 10*3/uL — ABNORMAL LOW (ref 150.0–400.0)
RBC: 5.28 Mil/uL (ref 4.22–5.81)
RDW: 13.4 % (ref 11.5–14.6)
WBC: 3.7 10*3/uL — AB (ref 4.5–10.5)

## 2013-10-13 LAB — BASIC METABOLIC PANEL
BUN: 16 mg/dL (ref 6–23)
CO2: 34 mEq/L — ABNORMAL HIGH (ref 19–32)
Calcium: 9.4 mg/dL (ref 8.4–10.5)
Chloride: 104 mEq/L (ref 96–112)
Creatinine, Ser: 1.2 mg/dL (ref 0.4–1.5)
GFR: 68.45 mL/min (ref >60.00)
Glucose, Bld: 78 mg/dL (ref 70–99)
Potassium: 4.5 mEq/L (ref 3.5–5.1)
SODIUM: 141 meq/L (ref 135–145)

## 2013-10-13 LAB — LIPID PANEL
CHOL/HDL RATIO: 3
Cholesterol: 167 mg/dL (ref 0–200)
HDL: 52.9 mg/dL (ref >39.00)
LDL Cholesterol: 105 mg/dL — ABNORMAL HIGH (ref 0–99)
TRIGLYCERIDES: 48 mg/dL (ref 0.0–149.0)
VLDL: 9.6 mg/dL (ref 0.0–40.0)

## 2013-10-13 LAB — HEPATIC FUNCTION PANEL
ALK PHOS: 49 U/L (ref 39–117)
ALT: 24 U/L (ref 0–53)
AST: 23 U/L (ref 0–37)
Albumin: 4.3 g/dL (ref 3.5–5.2)
BILIRUBIN TOTAL: 1 mg/dL (ref 0.3–1.2)
Bilirubin, Direct: 0.1 mg/dL (ref 0.0–0.3)
Total Protein: 6.7 g/dL (ref 6.0–8.3)

## 2013-10-13 LAB — TSH: TSH: 1.84 u[IU]/mL (ref 0.35–5.50)

## 2013-10-13 LAB — PSA: PSA: 0.44 ng/mL (ref 0.10–4.00)

## 2013-10-17 ENCOUNTER — Encounter: Payer: Self-pay | Admitting: Internal Medicine

## 2013-10-17 ENCOUNTER — Ambulatory Visit (INDEPENDENT_AMBULATORY_CARE_PROVIDER_SITE_OTHER): Payer: BC Managed Care – PPO | Admitting: Internal Medicine

## 2013-10-17 VITALS — BP 108/64 | HR 92 | Temp 97.9°F | Ht 70.0 in | Wt 172.0 lb

## 2013-10-17 DIAGNOSIS — Z0000 Encounter for general adult medical examination without abnormal findings: Secondary | ICD-10-CM

## 2013-10-17 NOTE — Progress Notes (Signed)
cpx  Past Medical History  Diagnosis Date  . Allergy   . Personal history of colonic adenoma 08/10/2013    History   Social History  . Marital Status: Married    Spouse Name: N/A    Number of Children: N/A  . Years of Education: N/A   Occupational History  . Not on file.   Social History Main Topics  . Smoking status: Never Smoker   . Smokeless tobacco: Not on file  . Alcohol Use: Yes  . Drug Use: Not on file  . Sexual Activity: Not on file   Other Topics Concern  . Not on file   Social History Narrative  . No narrative on file    Past Surgical History  Procedure Laterality Date  . Knee arthroscopy      Family History  Problem Relation Age of Onset  . Heart disease Father     aortic vavle disease (replaced)  . Colon cancer Neg Hx   . Pancreatic cancer Neg Hx   . Stomach cancer Neg Hx     No Known Allergies  Current Outpatient Prescriptions on File Prior to Visit  Medication Sig Dispense Refill  . doxycycline (ORACEA) 40 MG capsule Take 40 mg by mouth every morning.      . fexofenadine (ALLEGRA) 180 MG tablet Take 1 tablet (180 mg total) by mouth daily.  30 tablet  11  . fluticasone (FLONASE) 50 MCG/ACT nasal spray Place 2 sprays into the nose daily.  16 g  11  . MIRVASO 0.33 % GEL       . zolpidem (AMBIEN) 5 MG tablet TAKE 1 TABLET BY MOUTH AT BEDTIME AS NEEDED. DO NOT EXCEED 2 TABLETS PER WEEK  20 tablet  2   No current facility-administered medications on file prior to visit.     patient denies chest pain, shortness of breath, orthopnea. Denies lower extremity edema, abdominal pain, change in appetite, change in bowel movements. Patient denies rashes, musculoskeletal complaints. No other specific complaints in a complete review of systems.   Reviewed vitals Well-developed male in no acute distress. HEENT exam atraumatic, normocephalic, extraocular muscles are intact. Conjunctivae are pink without exudate. Neck is supple without lymphadenopathy,  thyromegaly, jugular venous distention. Chest is clear to auscultation without increased work of breathing. Cardiac exam S1-S2 are regular. The PMI is normal. No significant murmurs or gallops. Abdominal exam active bowel sounds, soft, nontender. No abdominal bruits. Extremities no clubbing cyanosis or edema. Peripheral pulses are normal without bruits. Neurologic exam alert and oriented without any motor or sensory deficits.   Well visit- health maint UTD

## 2013-10-17 NOTE — Progress Notes (Signed)
Pre visit review using our clinic review tool, if applicable. No additional management support is needed unless otherwise documented below in the visit note. 

## 2013-10-20 ENCOUNTER — Encounter: Payer: BC Managed Care – PPO | Admitting: Internal Medicine

## 2013-12-15 ENCOUNTER — Other Ambulatory Visit: Payer: Self-pay | Admitting: Internal Medicine

## 2013-12-22 ENCOUNTER — Telehealth: Payer: Self-pay | Admitting: Internal Medicine

## 2013-12-22 MED ORDER — FLUTICASONE PROPIONATE 50 MCG/ACT NA SUSP: 2.0000 | Freq: Every day | NASAL | Status: DC

## 2013-12-22 NOTE — Telephone Encounter (Signed)
Pt is needing new rx for fluticasone (FLONASE) 50 MCG/ACT nasal spray, sent to [facility] [address]

## 2013-12-22 NOTE — Telephone Encounter (Signed)
rx sent in electronically 

## 2014-01-17 ENCOUNTER — Other Ambulatory Visit: Payer: Self-pay | Admitting: Dermatology

## 2014-05-24 ENCOUNTER — Telehealth: Payer: Self-pay | Admitting: Internal Medicine

## 2014-05-24 NOTE — Telephone Encounter (Signed)
ok 

## 2014-05-24 NOTE — Telephone Encounter (Signed)
Pt would like to est with dr Burnice Logan. Can I sch?

## 2014-05-28 NOTE — Telephone Encounter (Signed)
lmom for pt to sch °

## 2014-06-01 NOTE — Telephone Encounter (Signed)
lmom for pt to cb

## 2014-06-04 NOTE — Telephone Encounter (Addendum)
appt has been sch °

## 2014-06-25 ENCOUNTER — Encounter: Payer: Self-pay | Admitting: Internal Medicine

## 2014-06-25 ENCOUNTER — Ambulatory Visit (INDEPENDENT_AMBULATORY_CARE_PROVIDER_SITE_OTHER): Payer: BC Managed Care – PPO | Admitting: Internal Medicine

## 2014-06-25 VITALS — BP 122/80 | HR 68 | Temp 97.9°F | Resp 20 | Ht 70.0 in | Wt 176.0 lb

## 2014-06-25 DIAGNOSIS — J3089 Other allergic rhinitis: Secondary | ICD-10-CM

## 2014-06-25 NOTE — Progress Notes (Deleted)
I

## 2014-06-25 NOTE — Progress Notes (Deleted)
Patient ID: Cody Knapp, male   DOB: [birth year], 53 y.o.   MRN: [id number] Past Medical History  Diagnosis Date  . Allergy   . Personal history of colonic adenoma 08/10/2013   Patient Active Problem List   Diagnosis Date Noted  . ALLERGIC RHINITIS 05/26/2007    Priority: Medium  . Personal history of colonic adenoma 08/10/2013  . THROMBOCYTOPENIA 06/18/2010  . [place], MILD 06/18/2010  . ROSACEA 11/02/2007  . DERMATITIS, SCALP 08/03/2006   HPI  ROS

## 2014-06-25 NOTE — Progress Notes (Signed)
Pre visit review using our clinic review tool, if applicable. No additional management support is needed unless otherwise documented below in the visit note. 

## 2014-06-25 NOTE — Progress Notes (Signed)
Subjective:    Patient ID: Cody Knapp, male    DOB: Jan 18, 1961, 53 y.o.   MRN: [id number]  HPI 53 year old patient who is seen today to establish with my practice.  He enjoys excellent health.  He has a history of seasonal allergic rhinitis and otherwise does quite well.  He remains on antibiotic therapy for rosacea.  He has a history clonic polyps and will have follow-up colonoscopy in 4 years.  He exercises regularly and is active with soccer.  No concerns or complaints.  Lifelong nonsmoker  Family history fairly noncontributory.  Father is 51.  Status post aortic valve repair history chronic low back pain.  Mother age 81.  Also does quite well with some age-related arthritis.  One brother, one sister in excellent health.  Past Medical History  Diagnosis Date  . Allergy   . Personal history of colonic adenoma 08/10/2013    History   Social History  . Marital Status: Married    Spouse Name: N/A    Number of Children: N/A  . Years of Education: N/A   Occupational History  . Not on file.   Social History Main Topics  . Smoking status: Never Smoker   . Smokeless tobacco: Not on file  . Alcohol Use: Yes  . Drug Use: Not on file  . Sexual Activity: Not on file   Other Topics Concern  . Not on file   Social History Narrative    Past Surgical History  Procedure Laterality Date  . Knee arthroscopy      Family History  Problem Relation Age of Onset  . Heart disease Father     aortic vavle disease (replaced)  . Colon cancer Neg Hx   . Pancreatic cancer Neg Hx   . Stomach cancer Neg Hx     No Known Allergies  Current Outpatient Prescriptions on File Prior to Visit  Medication Sig Dispense Refill  . fexofenadine (ALLEGRA) 180 MG tablet Take 1 tablet (180 mg total) by mouth daily. 30 tablet 11  . fluticasone (FLONASE) 50 MCG/ACT nasal spray Place 2 sprays into both nostrils daily. 16 g 11  . zolpidem (AMBIEN) 10 MG tablet TAKE 1/2 TO 1 TABLET AT BEDTIME AS  NEEDED FOR SLEEEP 30 tablet 5   No current facility-administered medications on file prior to visit.    BP 122/80 mmHg  Pulse 68  Temp(Src) 97.9 F (36.6 C) (Oral)  Resp 20  Ht 5\' 10"  (1.778 m)  Wt 176 lb (79.833 kg)  BMI 25.25 kg/m2  SpO2 98%      Review of Systems  Constitutional: Negative for fever, chills, appetite change and fatigue.  HENT: Negative for congestion, dental problem, ear pain, hearing loss, sore throat, tinnitus, trouble swallowing and voice change.   Eyes: Negative for pain, discharge and visual disturbance.  Respiratory: Negative for cough, chest tightness, wheezing and stridor.   Cardiovascular: Negative for chest pain, palpitations and leg swelling.  Gastrointestinal: Negative for nausea, vomiting, abdominal pain, diarrhea, constipation, blood in stool and abdominal distention.  Genitourinary: Negative for urgency, hematuria, flank pain, discharge, difficulty urinating and genital sores.  Musculoskeletal: Negative for myalgias, back pain, joint swelling, arthralgias, gait problem and neck stiffness.  Skin: Negative for rash.  Neurological: Negative for dizziness, syncope, speech difficulty, weakness, numbness and headaches.  Hematological: Negative for adenopathy. Does not bruise/bleed easily.  Psychiatric/Behavioral: Negative for behavioral problems and dysphoric mood. The patient is not nervous/anxious.        Objective:  Physical Exam  Constitutional: He is oriented to person, place, and time. He appears well-developed.  HENT:  Head: Normocephalic.  Right Ear: External ear normal.  Left Ear: External ear normal.  Eyes: Conjunctivae and EOM are normal.  Neck: Normal range of motion.  Cardiovascular: Normal rate and normal heart sounds.   Pulmonary/Chest: Breath sounds normal.  Abdominal: Bowel sounds are normal.  Musculoskeletal: Normal range of motion. He exhibits no edema or tenderness.  Neurological: He is alert and oriented to person,  place, and time.  Psychiatric: He has a normal mood and affect. His behavior is normal.          Assessment & Plan:   Allergic rhinitis Wellness exam Rosacea  Patient does have a annual exam scheduled for next year.  Medical regimen unchanged.  Medicines refilled.  Will reassess at that time.  Otherwise, return here as needed

## 2014-06-25 NOTE — Patient Instructions (Signed)
It  is important that you exercise regularly, at least 20 minutes 3 to 4 times per week.  If you develop chest pain or shortness of breath seek  medical attention.  Annual exam as scheduled

## 2014-08-22 ENCOUNTER — Telehealth: Payer: Self-pay | Admitting: Internal Medicine

## 2014-08-22 NOTE — Telephone Encounter (Addendum)
Pt saw dr Raliegh Ip on 06/25/14 for est visit with new provider and was billed 235.00. Pt feels like he should not have to pay because he did not ask for appt. Pt said if he know there will be a charge he would not have come to office. Pt called billing and was directed to call our office.

## 2014-08-23 NOTE — Telephone Encounter (Signed)
Pt has a $1500.00 deductable and his [id number] applied to that. I will call him and explain.

## 2014-08-30 NOTE — Telephone Encounter (Signed)
Pt is aware he will get callback tomorrow

## 2014-08-31 NOTE — Telephone Encounter (Signed)
Called patient back and LM to return my call if has additional questions.

## 2014-09-12 NOTE — Telephone Encounter (Signed)
Pt is very upset that we charged him for an establishment visit. Feels that his doctor left and he did [place] a courtesy in staying with the practice. I expressed understanding but explained that est visits are required and that in this case it did apply to his deductable. He is still not pleased and would like to talk with the site manager to have bill absolved.

## 2014-10-12 ENCOUNTER — Other Ambulatory Visit (INDEPENDENT_AMBULATORY_CARE_PROVIDER_SITE_OTHER): Payer: BLUE CROSS/BLUE SHIELD

## 2014-10-12 DIAGNOSIS — Z0000 Encounter for general adult medical examination without abnormal findings: Secondary | ICD-10-CM

## 2014-10-12 LAB — BASIC METABOLIC PANEL
BUN: 17 mg/dL (ref 6–23)
CALCIUM: 9.2 mg/dL (ref 8.4–10.5)
CHLORIDE: 104 meq/L (ref 96–112)
CO2: 32 meq/L (ref 19–32)
CREATININE: 1.21 mg/dL (ref 0.40–1.50)
GFR: 66.89 mL/min (ref >60.00)
Glucose, Bld: 77 mg/dL (ref 70–99)
Potassium: 3.6 mEq/L (ref 3.5–5.1)
Sodium: 142 mEq/L (ref 135–145)

## 2014-10-12 LAB — CBC WITH DIFFERENTIAL/PLATELET
BASOS PCT: 0.9 % (ref 0.0–3.0)
Basophils Absolute: 0 10*3/uL (ref 0.0–0.1)
EOS ABS: 0.2 10*3/uL (ref 0.0–0.7)
EOS PCT: 5.8 % — AB (ref 0.0–5.0)
HEMATOCRIT: 43.4 % (ref 39.0–52.0)
Hemoglobin: 15.1 g/dL (ref 13.0–17.0)
LYMPHS ABS: 0.9 10*3/uL (ref 0.7–4.0)
Lymphocytes Relative: 30.4 % (ref 12.0–46.0)
MCHC: 34.7 g/dL (ref 30.0–36.0)
MCV: 92.4 fl (ref 78.0–100.0)
Monocytes Absolute: 0.4 10*3/uL (ref 0.1–1.0)
Monocytes Relative: 11.5 % (ref 3.0–12.0)
Neutro Abs: 1.6 10*3/uL (ref 1.4–7.7)
Neutrophils Relative %: 51.4 % (ref 43.0–77.0)
Platelets: 129 10*3/uL — ABNORMAL LOW (ref 150.0–400.0)
RBC: 4.7 Mil/uL (ref 4.22–5.81)
RDW: 13.2 % (ref 11.5–15.5)
WBC: 3.1 10*3/uL — ABNORMAL LOW (ref 4.0–10.5)

## 2014-10-12 LAB — HEPATIC FUNCTION PANEL
ALT: 20 U/L (ref 0–53)
AST: 19 U/L (ref 0–37)
Albumin: 4.1 g/dL (ref 3.5–5.2)
Alkaline Phosphatase: 52 U/L (ref 39–117)
Bilirubin, Direct: 0.2 mg/dL (ref 0.0–0.3)
Total Bilirubin: 0.9 mg/dL (ref 0.2–1.2)
Total Protein: 6.1 g/dL (ref 6.0–8.3)

## 2014-10-12 LAB — POCT URINALYSIS DIP (MANUAL ENTRY)
GLUCOSE UA: NEGATIVE
Ketones, POC UA: NEGATIVE
Leukocytes, UA: NEGATIVE
Nitrite, UA: NEGATIVE
Protein Ur, POC: NEGATIVE
RBC UA: NEGATIVE
Spec Grav, UA: 1.02
Urobilinogen, UA: 0.2
pH, UA: 5.5

## 2014-10-12 LAB — LIPID PANEL
CHOL/HDL RATIO: 3
Cholesterol: 135 mg/dL (ref 0–200)
HDL: 43.5 mg/dL (ref >39.00)
LDL Cholesterol: 82 mg/dL (ref 0–99)
NonHDL: 91.5
TRIGLYCERIDES: 48 mg/dL (ref 0.0–149.0)
VLDL: 9.6 mg/dL (ref 0.0–40.0)

## 2014-10-12 LAB — PSA: PSA: 0.45 ng/mL (ref 0.10–4.00)

## 2014-10-12 LAB — TSH: TSH: 1.99 u[IU]/mL (ref 0.35–4.50)

## 2014-10-25 ENCOUNTER — Encounter: Payer: Self-pay | Admitting: Internal Medicine

## 2014-10-25 ENCOUNTER — Ambulatory Visit (INDEPENDENT_AMBULATORY_CARE_PROVIDER_SITE_OTHER): Payer: BLUE CROSS/BLUE SHIELD | Admitting: Internal Medicine

## 2014-10-25 VITALS — BP 110/74 | HR 69 | Temp 98.2°F | Resp 18 | Ht 69.5 in | Wt 172.0 lb

## 2014-10-25 DIAGNOSIS — Z0000 Encounter for general adult medical examination without abnormal findings: Secondary | ICD-10-CM

## 2014-10-25 DIAGNOSIS — D696 Thrombocytopenia, unspecified: Secondary | ICD-10-CM

## 2014-10-25 DIAGNOSIS — Z86010 Personal history of colonic polyps: Secondary | ICD-10-CM

## 2014-10-25 DIAGNOSIS — D72819 Decreased white blood cell count, unspecified: Secondary | ICD-10-CM

## 2014-10-25 DIAGNOSIS — J3089 Other allergic rhinitis: Secondary | ICD-10-CM

## 2014-10-25 MED ORDER — FLUTICASONE PROPIONATE 50 MCG/ACT NA SUSP: 2.0000 | Freq: Every day | NASAL | Status: DC

## 2014-10-25 MED ORDER — ZOLPIDEM TARTRATE 10 MG PO TABS: ORAL_TABLET | ORAL | Status: DC

## 2014-10-25 NOTE — Progress Notes (Signed)
Pre visit review using our clinic review tool, if applicable. No additional management support is needed unless otherwise documented below in the visit note. 

## 2014-10-25 NOTE — Patient Instructions (Signed)

## 2014-10-25 NOTE — Progress Notes (Signed)
Subjective:    Patient ID: Cody Knapp, male    DOB: 26-Feb-1961, 54 y.o.   MRN: [id number]  HPI 54 year old patient who is seen today for an annual health assessment.  He has a history of seasonal allergic rhinitis and otherwise does quite well. He remains on antibiotic therapy for rosacea. He has a history clonic polyps and will have follow-up colonoscopy in 4 years. He exercises regularly and is active with soccer. No concerns or complaints. Lifelong nonsmoker  Family history fairly noncontributory. Father is 79. Status post aortic valve repair history chronic low back pain. Mother age 5. Also does quite well with some age-related arthritis. One brother, one sister in excellent health.  Social history.  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 Allergy   . Personal history of colonic adenoma 08/10/2013    History   Social History  . Marital Status: Married    Spouse Name: N/A  . Number of Children: N/A  . Years of Education: N/A   Occupational History  . Not on file.   Social History Main Topics  . Smoking status: Never Smoker   . Smokeless tobacco: Not on file  . Alcohol Use: Yes  . Drug Use: Not on file  . Sexual Activity: Not on file   Other Topics Concern  . Not on file   Social History Narrative    Past Surgical History  Procedure Laterality Date  . Knee arthroscopy      Family History  Problem Relation Age of Onset  . Heart disease Father     aortic vavle disease (replaced)  . Colon cancer Neg Hx   . Pancreatic cancer Neg Hx   . Stomach cancer Neg Hx     No Known Allergies  Current Outpatient Prescriptions on File Prior to Visit  Medication Sig Dispense Refill  . ampicillin (PRINCIPEN) 500 MG capsule Take 500 mg by mouth 2 (two) times daily.   6  . fexofenadine (ALLEGRA) 180 MG tablet Take 1 tablet (180 mg total) by mouth daily. 30 tablet 11   No current facility-administered medications on  file prior to visit.    BP 110/74 mmHg  Pulse 69  Temp(Src) 98.2 F (36.8 C) (Oral)  Resp 18  Ht 5' 9.5" (1.765 m)  Wt 172 lb (78.019 kg)  BMI 25.04 kg/m2  SpO2 96%      Review of Systems  Constitutional: Negative for fever, chills, activity change, appetite change and fatigue.  HENT: Negative for congestion, dental problem, ear pain, hearing loss, mouth sores, rhinorrhea, sinus pressure, sneezing, tinnitus, trouble swallowing and voice change.   Eyes: Negative for photophobia, pain, redness and visual disturbance.  Respiratory: Negative for apnea, cough, choking, chest tightness, shortness of breath and wheezing.   Cardiovascular: Negative for chest pain, palpitations and leg swelling.  Gastrointestinal: Negative for nausea, vomiting, abdominal pain, diarrhea, constipation, blood in stool, abdominal distention, anal bleeding and rectal pain.  Genitourinary: Negative for dysuria, urgency, frequency, hematuria, flank pain, decreased urine volume, discharge, penile swelling, scrotal swelling, difficulty urinating, genital sores and testicular pain.  Musculoskeletal: Negative for myalgias, back pain, joint swelling, arthralgias, gait problem, neck pain and neck stiffness.  Skin: Negative for color change, rash and wound.  Neurological: Negative for dizziness, tremors, seizures, syncope, facial asymmetry, speech difficulty, weakness, light-headedness, numbness and headaches.  Hematological: Negative for adenopathy. Does not bruise/bleed easily.  Psychiatric/Behavioral: Negative for suicidal ideas, hallucinations, behavioral problems, confusion, sleep disturbance,  self-injury, dysphoric mood, decreased concentration and agitation. The patient is not nervous/anxious.        Objective:   Physical Exam  Constitutional: He appears well-developed and well-nourished.  HENT:  Head: Normocephalic and atraumatic.  Right Ear: External ear normal.  Left Ear: External ear normal.  Nose: Nose  normal.  Mouth/Throat: Oropharynx is clear and moist.  Eyes: Conjunctivae and EOM are normal. Pupils are equal, round, and reactive to light. No scleral icterus.  Neck: Normal range of motion. Neck supple. No JVD present. No thyromegaly present.  Cardiovascular: Regular rhythm, normal heart sounds and intact distal pulses.  Exam reveals no gallop and no friction rub.   No murmur heard. Pulmonary/Chest: Effort normal and breath sounds normal. He exhibits no tenderness.  Abdominal: Soft. Bowel sounds are normal. He exhibits no distension and no mass. There is no tenderness.  Genitourinary: Prostate normal and penis normal. Guaiac negative stool.  Musculoskeletal: Normal range of motion. He exhibits no edema or tenderness.  Lymphadenopathy:    He has no cervical adenopathy.  Neurological: He is alert. He has normal reflexes. No cranial nerve deficit. Coordination normal.  Skin: Skin is warm and dry. No rash noted.  Psychiatric: He has a normal mood and affect. His behavior is normal.          Assessment & Plan:   preventive health exam Allergic rhinitis, stable History of colonic polyps.  Repeat colonoscopies at five-year intervals  Recheck in one year or as needed Medications updated

## 2015-10-28 ENCOUNTER — Encounter: Payer: BLUE CROSS/BLUE SHIELD | Admitting: Internal Medicine

## 2015-10-28 ENCOUNTER — Other Ambulatory Visit (INDEPENDENT_AMBULATORY_CARE_PROVIDER_SITE_OTHER): Payer: BLUE CROSS/BLUE SHIELD

## 2015-10-28 DIAGNOSIS — Z0000 Encounter for general adult medical examination without abnormal findings: Secondary | ICD-10-CM

## 2015-10-28 LAB — POC URINALSYSI DIPSTICK (AUTOMATED)
Bilirubin, UA: NEGATIVE
Blood, UA: NEGATIVE
GLUCOSE UA: NEGATIVE
KETONES UA: NEGATIVE
LEUKOCYTES UA: NEGATIVE
Nitrite, UA: NEGATIVE
PROTEIN UA: NEGATIVE
Spec Grav, UA: 1.02
UROBILINOGEN UA: 0.2
pH, UA: 7

## 2015-10-28 LAB — CBC WITH DIFFERENTIAL/PLATELET
BASOS ABS: 0 10*3/uL (ref 0.0–0.1)
Basophils Relative: 0.5 % (ref 0.0–3.0)
EOS ABS: 0 10*3/uL (ref 0.0–0.7)
Eosinophils Relative: 1.2 % (ref 0.0–5.0)
HEMATOCRIT: 41.9 % (ref 39.0–52.0)
Hemoglobin: 14.4 g/dL (ref 13.0–17.0)
LYMPHS PCT: 20 % (ref 12.0–46.0)
Lymphs Abs: 0.8 10*3/uL (ref 0.7–4.0)
MCHC: 34.4 g/dL (ref 30.0–36.0)
MCV: 91.2 fl (ref 78.0–100.0)
MONOS PCT: 8.9 % (ref 3.0–12.0)
Monocytes Absolute: 0.4 10*3/uL (ref 0.1–1.0)
NEUTROS ABS: 2.8 10*3/uL (ref 1.4–7.7)
Neutrophils Relative %: 69.4 % (ref 43.0–77.0)
PLATELETS: 122 10*3/uL — AB (ref 150.0–400.0)
RBC: 4.59 Mil/uL (ref 4.22–5.81)
RDW: 13.3 % (ref 11.5–15.5)
WBC: 4 10*3/uL (ref 4.0–10.5)

## 2015-10-28 LAB — PSA: PSA: 0.46 ng/mL (ref 0.10–4.00)

## 2015-10-28 LAB — LIPID PANEL
CHOLESTEROL: 145 mg/dL (ref 0–200)
HDL: 40 mg/dL (ref >39.00)
LDL CALC: 92 mg/dL (ref 0–99)
NonHDL: 105.11
Total CHOL/HDL Ratio: 4
Triglycerides: 68 mg/dL (ref 0.0–149.0)
VLDL: 13.6 mg/dL (ref 0.0–40.0)

## 2015-10-28 LAB — HEPATIC FUNCTION PANEL
ALK PHOS: 54 U/L (ref 39–117)
ALT: 21 U/L (ref 0–53)
AST: 19 U/L (ref 0–37)
Albumin: 4 g/dL (ref 3.5–5.2)
BILIRUBIN DIRECT: 0.1 mg/dL (ref 0.0–0.3)
TOTAL PROTEIN: 5.9 g/dL — AB (ref 6.0–8.3)
Total Bilirubin: 0.5 mg/dL (ref 0.2–1.2)

## 2015-10-28 LAB — BASIC METABOLIC PANEL
BUN: 16 mg/dL (ref 6–23)
CALCIUM: 9 mg/dL (ref 8.4–10.5)
CO2: 32 meq/L (ref 19–32)
CREATININE: 1.09 mg/dL (ref 0.40–1.50)
Chloride: 104 mEq/L (ref 96–112)
GFR: 75.15 mL/min (ref >60.00)
Glucose, Bld: 101 mg/dL — ABNORMAL HIGH (ref 70–99)
Potassium: 3.6 mEq/L (ref 3.5–5.1)
SODIUM: 142 meq/L (ref 135–145)

## 2015-10-28 LAB — TSH: TSH: 1.45 u[IU]/mL (ref 0.35–4.50)

## 2015-10-31 ENCOUNTER — Telehealth: Payer: Self-pay | Admitting: Internal Medicine

## 2015-10-31 MED ORDER — ZOLPIDEM TARTRATE 10 MG PO TABS: ORAL_TABLET | ORAL | Status: DC

## 2015-10-31 NOTE — Telephone Encounter (Signed)
Pt request refill  zolpidem (AMBIEN) 10 MG tablet  Pt has CPE on 3/24, but states he is out and needs asap  [facility]

## 2015-10-31 NOTE — Telephone Encounter (Signed)
Left message on voicemail Rx called into pharmacy  

## 2015-11-04 ENCOUNTER — Encounter: Payer: BLUE CROSS/BLUE SHIELD | Admitting: Internal Medicine

## 2015-11-15 ENCOUNTER — Ambulatory Visit (INDEPENDENT_AMBULATORY_CARE_PROVIDER_SITE_OTHER): Payer: BLUE CROSS/BLUE SHIELD | Admitting: Internal Medicine

## 2015-11-15 ENCOUNTER — Encounter: Payer: Self-pay | Admitting: Internal Medicine

## 2015-11-15 VITALS — BP 110/70 | HR 76 | Temp 98.1°F | Resp 20 | Ht 69.0 in | Wt 175.0 lb

## 2015-11-15 DIAGNOSIS — J309 Allergic rhinitis, unspecified: Secondary | ICD-10-CM | POA: Diagnosis not present

## 2015-11-15 DIAGNOSIS — Z86010 Personal history of colonic polyps: Secondary | ICD-10-CM

## 2015-11-15 DIAGNOSIS — Z0000 Encounter for general adult medical examination without abnormal findings: Secondary | ICD-10-CM

## 2015-11-15 NOTE — Progress Notes (Signed)
Pre visit review using our clinic review tool, if applicable. No additional management support is needed unless otherwise documented below in the visit note. 

## 2015-11-15 NOTE — Patient Instructions (Signed)

## 2015-11-15 NOTE — Progress Notes (Signed)
Subjective:    Patient ID: Cody Knapp, male    DOB: [birth year], 55 y.o.   MRN: [id number]  HPI  55 -year-old patient who is seen today for an annual health assessment.  He has a history of seasonal allergic rhinitis and otherwise does quite well. He remains on antibiotic therapy for rosacea. He has a history clonic polyps and will have follow-up colonoscopy in 3 years. He exercises regularly and is active with soccer  Until this past year.  He still remains active at his local health club. No concerns or complaints. Lifelong nonsmoker  Family history fairly noncontributory. Father is 54. Status post aortic valve repair history chronic low back pain. Mother age 54. Also does quite well with some age-related arthritis. One brother, one sister in excellent health.  Social history.  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 Allergy   . Personal history of colonic adenoma 08/10/2013    Social History   Social History  . Marital Status: Married    Spouse Name: N/A  . Number of Children: N/A  . Years of Education: N/A   Occupational History  . Not on file.   Social History Main Topics  . Smoking status: Never Smoker   . Smokeless tobacco: Not on file  . Alcohol Use: Yes  . Drug Use: Not on file  . Sexual Activity: Not on file   Other Topics Concern  . Not on file   Social History Narrative    Past Surgical History  Procedure Laterality Date  . Knee arthroscopy      Family History  Problem Relation Age of Onset  . Heart disease Father     aortic vavle disease (replaced)  . Colon cancer Neg Hx   . Pancreatic cancer Neg Hx   . Stomach cancer Neg Hx     No Known Allergies  Current Outpatient Prescriptions on File Prior to Visit  Medication Sig Dispense Refill  . clobetasol (TEMOVATE) 0.05 % external solution Apply 1 application topically as needed.   0  . fexofenadine (ALLEGRA) 180  MG tablet Take 1 tablet (180 mg total) by mouth daily. 30 tablet 11  . fluticasone (FLONASE) 50 MCG/ACT nasal spray Place 2 sprays into both nostrils daily. 16 g 11  . zolpidem (AMBIEN) 10 MG tablet TAKE 1/2 TO 1 TABLET AT BEDTIME AS NEEDED FOR SLEEEP 30 tablet 0   No current facility-administered medications on file prior to visit.    BP 110/70 mmHg  Pulse 76  Temp(Src) 98.1 F (36.7 C) (Oral)  Resp 20  Ht 5\' 9"  (1.753 m)  Wt 175 lb (79.379 kg)  BMI 25.83 kg/m2  SpO2 98%      Review of Systems  Constitutional: Negative for fever, chills, activity change, appetite change and fatigue.  HENT: Negative for congestion, dental problem, ear pain, hearing loss, mouth sores, rhinorrhea, sinus pressure, sneezing, tinnitus, trouble swallowing and voice change.   Eyes: Negative for photophobia, pain, redness and visual disturbance.  Respiratory: Negative for apnea, cough, choking, chest tightness, shortness of breath and wheezing.   Cardiovascular: Negative for chest pain, palpitations and leg swelling.  Gastrointestinal: Negative for nausea, vomiting, abdominal pain, diarrhea, constipation, blood in stool, abdominal distention, anal bleeding and rectal pain.  Genitourinary: Negative for dysuria, urgency, frequency, hematuria, flank pain, decreased urine volume, discharge, penile swelling, scrotal swelling, difficulty urinating, genital sores and testicular pain.  Musculoskeletal: Negative for  myalgias, back pain, joint swelling, arthralgias, gait problem, neck pain and neck stiffness.  Skin: Negative for color change, rash and wound.  Neurological: Negative for dizziness, tremors, seizures, syncope, facial asymmetry, speech difficulty, weakness, light-headedness, numbness and headaches.  Hematological: Negative for adenopathy. Does not bruise/bleed easily.  Psychiatric/Behavioral: Negative for suicidal ideas, hallucinations, behavioral problems, confusion, sleep disturbance, self-injury,  dysphoric mood, decreased concentration and agitation. The patient is not nervous/anxious.        Objective:   Physical Exam  Constitutional: He appears well-developed and well-nourished.  HENT:  Head: Normocephalic and atraumatic.  Right Ear: External ear normal.  Left Ear: External ear normal.  Nose: Nose normal.  Mouth/Throat: Oropharynx is clear and moist.  Eyes: Conjunctivae and EOM are normal. Pupils are equal, round, and reactive to light. No scleral icterus.  Neck: Normal range of motion. Neck supple. No JVD present. No thyromegaly present.  Cardiovascular: Regular rhythm, normal heart sounds and intact distal pulses.  Exam reveals no gallop and no friction rub.   No murmur heard. Pulmonary/Chest: Effort normal and breath sounds normal. He exhibits no tenderness.  Abdominal: Soft. Bowel sounds are normal. He exhibits no distension and no mass. There is no tenderness.  Genitourinary: Prostate normal and penis normal. Guaiac negative stool.  Musculoskeletal: Normal range of motion. He exhibits no edema or tenderness.  Lymphadenopathy:    He has no cervical adenopathy.  Neurological: He is alert. He has normal reflexes. No cranial nerve deficit. Coordination normal.  Skin: Skin is warm and dry. No rash noted.  Psychiatric: He has a normal mood and affect. His behavior is normal.          Assessment & Plan:   preventive health exam Allergic rhinitis, stable History of colonic polyps.  Repeat colonoscopies at five-year intervals  Recheck in one year or as needed Medications updated

## 2015-11-18 ENCOUNTER — Telehealth: Payer: Self-pay | Admitting: Internal Medicine

## 2015-11-18 MED ORDER — ZOLPIDEM TARTRATE 10 MG PO TABS: ORAL_TABLET | ORAL | Status: DC

## 2015-11-18 NOTE — Telephone Encounter (Signed)
Pt notified Rx called into pharmacy 

## 2015-11-18 NOTE — Telephone Encounter (Signed)
Pt request refill of the following: zolpidem (AMBIEN) 10 MG tablet   Pt said he never received the above rx.   Phamacy:  [place]

## 2015-12-17 DIAGNOSIS — L719 Rosacea, unspecified: Secondary | ICD-10-CM | POA: Diagnosis not present

## 2016-05-20 DIAGNOSIS — L719 Rosacea, unspecified: Secondary | ICD-10-CM | POA: Diagnosis not present

## 2016-06-01 DIAGNOSIS — Z23 Encounter for immunization: Secondary | ICD-10-CM | POA: Diagnosis not present

## 2016-08-10 DIAGNOSIS — J069 Acute upper respiratory infection, unspecified: Secondary | ICD-10-CM | POA: Diagnosis not present

## 2016-08-10 DIAGNOSIS — Z008 Encounter for other general examination: Secondary | ICD-10-CM | POA: Diagnosis not present

## 2016-09-02 DIAGNOSIS — L814 Other melanin hyperpigmentation: Secondary | ICD-10-CM | POA: Diagnosis not present

## 2016-09-02 DIAGNOSIS — D1801 Hemangioma of skin and subcutaneous tissue: Secondary | ICD-10-CM | POA: Diagnosis not present

## 2016-09-02 DIAGNOSIS — L719 Rosacea, unspecified: Secondary | ICD-10-CM | POA: Diagnosis not present

## 2016-09-02 DIAGNOSIS — D225 Melanocytic nevi of trunk: Secondary | ICD-10-CM | POA: Diagnosis not present

## 2016-09-02 DIAGNOSIS — L570 Actinic keratosis: Secondary | ICD-10-CM | POA: Diagnosis not present

## 2017-06-03 ENCOUNTER — Encounter: Payer: Self-pay | Admitting: Internal Medicine

## 2017-06-03 ENCOUNTER — Ambulatory Visit (INDEPENDENT_AMBULATORY_CARE_PROVIDER_SITE_OTHER): Payer: BLUE CROSS/BLUE SHIELD | Admitting: Internal Medicine

## 2017-06-03 VITALS — BP 118/64 | HR 61 | Temp 97.7°F | Ht 69.0 in | Wt 177.0 lb

## 2017-06-03 DIAGNOSIS — Z0001 Encounter for general adult medical examination with abnormal findings: Secondary | ICD-10-CM | POA: Diagnosis not present

## 2017-06-03 DIAGNOSIS — B070 Plantar wart: Secondary | ICD-10-CM

## 2017-06-03 DIAGNOSIS — Z0000 Encounter for general adult medical examination without abnormal findings: Secondary | ICD-10-CM

## 2017-06-03 MED ORDER — ZOLPIDEM TARTRATE 10 MG PO TABS: ORAL_TABLET | ORAL | 0 refills | Status: DC

## 2017-06-03 NOTE — Patient Instructions (Addendum)
It is important that you exercise regularly, at least 20 minutes 3 to 4 times per week.  If you develop chest pain or shortness of breath seek  medical attention.   Health Maintenance, Male A healthy lifestyle and preventive care is important for your health and wellness. Ask your health care provider about what schedule of regular examinations is right for you. What should I know about weight and diet? Eat a Healthy Diet  Eat plenty of vegetables, fruits, whole grains, low-fat dairy products, and lean protein.  Do not eat a lot of foods high in solid fats, added sugars, or salt.  Maintain a Healthy Weight Regular exercise can help you achieve or maintain a healthy weight. You should:  Do at least 150 minutes of exercise each week. The exercise should increase your heart rate and make you sweat (moderate-intensity exercise).  Do strength-training exercises at least twice a week.  Watch Your Levels of Cholesterol and Blood Lipids  Have your blood tested for lipids and cholesterol every 5 years starting at 55 years of age. If you are at high risk for heart disease, you should start having your blood tested when you are 55 years old. You may need to have your cholesterol levels checked more often if: ? Your lipid or cholesterol levels are high. ? You are older than 55 years of age. ? You are at high risk for heart disease.  What should I know about cancer screening? Many types of cancers can be detected early and may often be prevented. Lung Cancer  You should be screened every year for lung cancer if: ? You are a current smoker who has smoked for at least 30 years. ? You are a former smoker who has quit within the past 15 years.  Talk to your health care provider about your screening options, when you should start screening, and how often you should be screened.  Colorectal Cancer  Routine colorectal cancer screening usually begins at 55 years of age and should be repeated every  5-10 years until you are 55 years old. You may need to be screened more often if early forms of precancerous polyps or small growths are found. Your health care provider may recommend screening at an earlier age if you have risk factors for colon cancer.  Your health care provider may recommend using home test kits to check for hidden blood in the stool.  A small camera at the end of a tube can be used to examine your colon (sigmoidoscopy or colonoscopy). This checks for the earliest forms of colorectal cancer.  Prostate and Testicular Cancer  Depending on your age and overall health, your health care provider may do certain tests to screen for prostate and testicular cancer.  Talk to your health care provider about any symptoms or concerns you have about testicular or prostate cancer.  Skin Cancer  Check your skin from head to toe regularly.  Tell your health care provider about any new moles or changes in moles, especially if: ? There is a change in a mole's size, shape, or color. ? You have a mole that is larger than a pencil eraser.  Always use sunscreen. Apply sunscreen liberally and repeat throughout the day.  Protect yourself by wearing long sleeves, pants, a wide-brimmed hat, and sunglasses when outside.  What should I know about heart disease, diabetes, and high blood pressure?  If you are 18-39 years of age, have your blood pressure checked every 3-5 years. If you   are 14 years of age or older, have your blood pressure checked every year. You should have your blood pressure measured twice-once when you are at a hospital or clinic, and once when you are not at a hospital or clinic. Record the average of the two measurements. To check your blood pressure when you are not at a hospital or clinic, you can use: ? An automated blood pressure machine at a pharmacy. ? A home blood pressure monitor.  Talk to your health care provider about your target blood pressure.  If you are  between 68-54 years old, ask your health care provider if you should take aspirin to prevent heart disease.  Have regular diabetes screenings by checking your fasting blood sugar level. ? If you are at a normal weight and have a low risk for diabetes, have this test once every three years after the age of 6. ? If you are overweight and have a high risk for diabetes, consider being tested at a younger age or more often.  A one-time screening for abdominal aortic aneurysm (AAA) by ultrasound is recommended for men aged 31-75 years who are current or former smokers. What should I know about preventing infection? Hepatitis B If you have a higher risk for hepatitis B, you should be screened for this virus. Talk with your health care provider to find out if you are at risk for hepatitis B infection. Hepatitis C Blood testing is recommended for:  Everyone born from [birth year] through 1964.  Anyone with known risk factors for hepatitis C.  Sexually Transmitted Diseases (STDs)  You should be screened each year for STDs including gonorrhea and chlamydia if: ? You are sexually active and are younger than 55 years of age. ? You are older than 55 years of age and your health care provider tells you that you are at risk for this type of infection. ? Your sexual activity has changed since you were last screened and you are at an increased risk for chlamydia or gonorrhea. Ask your health care provider if you are at risk.  Talk with your health care provider about whether you are at high risk of being infected with HIV. Your health care provider may recommend a prescription medicine to help prevent HIV infection.  What else can I do?  Schedule regular health, dental, and eye exams.  Stay current with your vaccines (immunizations).  Do not use any tobacco products, such as cigarettes, chewing tobacco, and e-cigarettes. If you need help quitting, ask your health care provider.  Limit alcohol intake to no  more than 2 drinks per day. One drink equals 12 ounces of beer, 5 ounces of wine, or 1 ounces of hard liquor.  Do not use street drugs.  Do not share needles.  Ask your health care provider for help if you need support or information about quitting drugs.  Tell your health care provider if you often feel depressed.  Tell your health care provider if you have ever been abused or do not feel safe at home. This information is not intended to replace advice given to you by your health care provider. Make sure you discuss any questions you have with your health care provider. Document Released: 02/06/2008 Document Revised: 04/08/2016 Document Reviewed: 05/14/2015 Elsevier Interactive Patient Education  2018 [employer].   Plantar Warts Warts are small growths on the skin. They can occur on various areas of the body. When they occur on the underside (sole) of the foot, they are  called plantar warts. Plantar warts often occur in groups, with several small warts around a larger growth. They tend to develop over areas of pressure, such as the heel or the ball of the foot. Most warts are not painful, and they usually do not cause problems. However, plantar warts may cause pain when you walk because pressure is applied to them. Warts often go away on their own in time. Various treatments may be done if needed. Sometimes, warts go away and then they come back again. What are the causes? Plantar warts are caused by a type of virus that is called human papillomavirus (HPV). HPV attacks a break in the skin of the foot. Walking barefoot can lead to exposure to the virus. These warts may spread to other areas of the sole. They spread to other areas of the body only through direct contact. What increases the risk? Plantar warts are more likely to develop in:  People who are 43-50 years of age.  People who use public showers or locker rooms.  People who have a weakened body defense system (immune  system).  What are the signs or symptoms? Plantar warts may be flat or slightly raised. They may grow into the deeper layers of skin or rise above the surface of the skin. Most plantar warts have a rough surface. They may cause pain when you use your foot to support your body weight. How is this diagnosed? A plantar wart can usually be diagnosed from its appearance. In some cases, a tissue sample may be removed (biopsy) to be looked at under a microscope. How is this treated? In many cases, warts do not need treatment. Without treatment, they often go away over a period of many months to a couple years. If treatment is needed, options may include:  Applying medicated solutions, creams, or patches to the wart. These may be over-the-counter or prescription medicines that make the skin soft so that layers will gradually shed away. In many cases, the medicine is applied one or two times per day and covered with a bandage.  Putting duct tape over the top of the wart (occlusion). You will leave the tape in place for as long as told by your health care provider, then you will replace it with a new strip of tape. This is done until the wart goes away.  Freezing the wart with liquid nitrogen (cryotherapy).  Burning the wart with: ? Laser treatment. ? An electrified probe (electrocautery).  Injection of a medicine (Candida antigen) into the wart to help the body's immune system to fight off the wart.  Surgery to remove the wart.  Follow these instructions at home:  Apply medicated creams or solutions only as told by your health care provider. This may involve: ? Soaking the affected area in warm water. ? Removing the top layer of softened skin before you apply the medicine. A pumice stone works well for removing the tissue. ? Applying a bandage over the affected area after you apply the medicine. ? Repeating the process daily or as told by your health care provider.  Do not scratch or pick at a  wart.  Wash your hands after you touch a wart.  If a wart is painful, try applying a bandage with a hole in the middle over the wart. The helps to take pressure off the wart.  Keep all follow-up visits as told by your health care provider. This is important. How is this prevented? Take these actions to help prevent  warts:  Wear shoes and socks. Change your socks daily.  Keep your feet clean and dry.  Check your feet regularly.  Avoid direct contact with warts on other people.  Contact a health care provider if:  Your warts do not improve after treatment.  You have redness, swelling, or pain at the site of a wart.  You have bleeding from a wart that does not stop with light pressure.  You have diabetes and you develop a wart. This information is not intended to replace advice given to you by your health care provider. Make sure you discuss any questions you have with your health care provider. Document Released: 10/31/2003 Document Revised: 01/16/2016 Document Reviewed: 11/05/2014 Elsevier Interactive Patient Education  [employer].

## 2017-06-03 NOTE — Progress Notes (Signed)
Subjective:    Patient ID: Cody Knapp, male    DOB: 23-Jun-1961, 56 y.o.   MRN: [id number]  HPI  56 year old patient who is seen today for an annual preventive health examination.  He continues to do quite well.  He does have a history colonic polyps and will need follow-up colonoscopy in about one year. He no longer participates with adult soccer but is active at his health club.  He has allergic rhinitis His only complaints are 3 plantar warts involving his left foot area. He has a history of chronic stable thrombocytopenia  Family history fairly noncontributory. Father is 5. Status post aortic valve repair history chronic low back pain. Mother age 84. Also does quite well with some age-related arthritis. One brother, one sister in excellent health.  Social history.  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 Allergy   . Personal history of colonic adenoma 08/10/2013     Social History   Social History  . Marital status: Married    Spouse name: N/A  . Number of children: N/A  . Years of education: N/A   Occupational History  . Not on file.   Social History Main Topics  . Smoking status: Never Smoker  . Smokeless tobacco: Never Used  . Alcohol use Yes  . Drug use: Unknown  . Sexual activity: Not on file   Other Topics Concern  . Not on file   Social History Narrative  . No narrative on file    Past Surgical History:  Procedure Laterality Date  . KNEE ARTHROSCOPY      Family History  Problem Relation Age of Onset  . Heart disease Father        aortic vavle disease (replaced)  . Colon cancer Neg Hx   . Pancreatic cancer Neg Hx   . Stomach cancer Neg Hx     No Known Allergies  Current Outpatient Prescriptions on File Prior to Visit  Medication Sig Dispense Refill  . fexofenadine (ALLEGRA) 180 MG tablet Take 1 tablet (180 mg total) by mouth daily. 30 tablet 11   No current  facility-administered medications on file prior to visit.     BP 118/64 (BP Location: Left Arm, Patient Position: Sitting, Cuff Size: Normal)   Pulse 61   Temp 97.7 F (36.5 C) (Oral)   Ht 5\' 9"  (1.753 m)   Wt 177 lb (80.3 kg)   SpO2 98%   BMI 26.14 kg/m      Review of Systems  Constitutional: Negative for appetite change, chills, fatigue and fever.  HENT: Negative for congestion, dental problem, ear pain, hearing loss, sore throat, tinnitus, trouble swallowing and voice change.   Eyes: Negative for pain, discharge and visual disturbance.  Respiratory: Negative for cough, chest tightness, wheezing and stridor.   Cardiovascular: Negative for chest pain, palpitations and leg swelling.  Gastrointestinal: Negative for abdominal distention, abdominal pain, blood in stool, constipation, diarrhea, nausea and vomiting.  Genitourinary: Negative for difficulty urinating, discharge, flank pain, genital sores, hematuria and urgency.  Musculoskeletal: Negative for arthralgias, back pain, gait problem, joint swelling, myalgias and neck stiffness.  Skin: Negative for rash.  Neurological: Negative for dizziness, syncope, speech difficulty, weakness, numbness and headaches.  Hematological: Negative for adenopathy. Does not bruise/bleed easily.  Psychiatric/Behavioral: Negative for behavioral problems and dysphoric mood. The patient is not nervous/anxious.        Objective:   Physical Exam  Constitutional: He appears well-developed and well-nourished.  HENT:  Head: Normocephalic and atraumatic.  Right Ear: External ear normal.  Left Ear: External ear normal.  Nose: Nose normal.  Mouth/Throat: Oropharynx is clear and moist.  Eyes: Pupils are equal, round, and reactive to light. Conjunctivae and EOM are normal. No scleral icterus.  Neck: Normal range of motion. Neck supple. No JVD present. No thyromegaly present.  Cardiovascular: Regular rhythm, normal heart sounds and intact distal pulses.   Exam reveals no gallop and no friction rub.   No murmur heard. Pulmonary/Chest: Effort normal and breath sounds normal. He exhibits no tenderness.  Abdominal: Soft. Bowel sounds are normal. He exhibits no distension and no mass. There is no tenderness.  Genitourinary: Prostate normal and penis normal. Rectal exam shows guaiac negative stool.  Musculoskeletal: Normal range of motion. He exhibits no edema or tenderness.  Lymphadenopathy:    He has no cervical adenopathy.  Neurological: He is alert. He has normal reflexes. No cranial nerve deficit. Coordination normal.  Skin: Skin is warm and dry. No rash noted.  Psychiatric: He has a normal mood and affect. His behavior is normal.          Assessment & Plan:   Preventive health examination.  Patient had a lab draw at work early in the week.  He will fax the results Flu vaccine administered Follow-up one year  Procedure note':  3 plantar warts were treated with cryotherapy involving his left foot, 1 over the third metatarsal head and the other over the fifth metatarsal head and the third over the heel area.  Patient tolerated the procedure well  Nyoka Cowden

## 2017-06-21 DIAGNOSIS — K635 Polyp of colon: Secondary | ICD-10-CM | POA: Diagnosis not present

## 2017-06-21 DIAGNOSIS — Z008 Encounter for other general examination: Secondary | ICD-10-CM | POA: Diagnosis not present

## 2017-07-05 DIAGNOSIS — L570 Actinic keratosis: Secondary | ICD-10-CM | POA: Diagnosis not present

## 2017-07-05 DIAGNOSIS — L719 Rosacea, unspecified: Secondary | ICD-10-CM | POA: Diagnosis not present

## 2017-07-05 DIAGNOSIS — L219 Seborrheic dermatitis, unspecified: Secondary | ICD-10-CM | POA: Diagnosis not present

## 2017-09-08 DIAGNOSIS — L814 Other melanin hyperpigmentation: Secondary | ICD-10-CM | POA: Diagnosis not present

## 2017-09-08 DIAGNOSIS — D1801 Hemangioma of skin and subcutaneous tissue: Secondary | ICD-10-CM | POA: Diagnosis not present

## 2017-09-08 DIAGNOSIS — L821 Other seborrheic keratosis: Secondary | ICD-10-CM | POA: Diagnosis not present

## 2017-09-08 DIAGNOSIS — D225 Melanocytic nevi of trunk: Secondary | ICD-10-CM | POA: Diagnosis not present

## 2017-09-08 DIAGNOSIS — L570 Actinic keratosis: Secondary | ICD-10-CM | POA: Diagnosis not present

## 2017-11-04 ENCOUNTER — Other Ambulatory Visit: Payer: Self-pay | Admitting: Internal Medicine

## 2017-11-12 DIAGNOSIS — L719 Rosacea, unspecified: Secondary | ICD-10-CM | POA: Diagnosis not present

## 2017-11-12 DIAGNOSIS — B356 Tinea cruris: Secondary | ICD-10-CM | POA: Diagnosis not present

## 2017-11-29 ENCOUNTER — Ambulatory Visit (INDEPENDENT_AMBULATORY_CARE_PROVIDER_SITE_OTHER): Payer: BLUE CROSS/BLUE SHIELD | Admitting: Internal Medicine

## 2017-11-29 ENCOUNTER — Encounter: Payer: Self-pay | Admitting: Internal Medicine

## 2017-11-29 VITALS — BP 120/78 | HR 78 | Temp 97.8°F | Wt 175.0 lb

## 2017-11-29 DIAGNOSIS — L740 Miliaria rubra: Secondary | ICD-10-CM | POA: Diagnosis not present

## 2017-11-29 MED ORDER — TRIAMCINOLONE ACETONIDE 0.1 % EX CREA: 1.0000 "application " | TOPICAL_CREAM | Freq: Two times a day (BID) | CUTANEOUS | 1 refills | Status: DC

## 2017-11-29 NOTE — Patient Instructions (Signed)
Heat Rash, Adult Heat rash is an itchy rash of little red bumps that often occurs during hot, humid weather. Heat rash is also called prickly heat or miliaria. Heat rash usually affects:  Armpits.  Elbows.  Groin.  Neck.  The area underneath the breasts.  Shoulders.  Chest.  What are the causes? This condition is caused by blocked sweat ducts. When sweat is trapped under the skin, it spreads into surrounding tissues and causes a rash of red bumps. What increases the risk? This condition is more likely to develop in people who:  Are overdressed in hot, humid weather.  Wear clothing that rubs against the skin.  Are active in hot, humid weather.  Sweat a lot.  Are not used to hot, humid weather.  What are the signs or symptoms? Symptoms of this condition include:  Small red bumps that are itchy or prickly.  Very little sweating or no sweating in the affected area.  How is this diagnosed? This condition is diagnosed based on your symptoms and medical history, as well as a physical exam. How is this treated? Moving to a cool, dry place is the best treatment for heat rash. Treatment may also include medicines, such as:  Corticosteroid creams for skin irritation.  Antibiotic medicines, if the rash becomes infected.  Follow these instructions at home: Skin care  Keep the affected area dry.  Do not apply ointments or creams that contain mineral oil or petroleum ingredients to your skin. These can make the condition worse.  Apply cool compresses to the affected areas.  Do not scratch your skin.  Do not take hot showers or baths. General instructions  Take over-the-counter and prescription medicines only as told by your health care provider.  If you were prescribed an antibiotic, take it as told by your health care provider. Do not stop taking it even if your condition improves.  Stay in a cool room as much as possible. Use an air conditioner or fan, if  possible.  Do not wear tight clothes. Wear comfortable, loose-fitting clothing.  Keep all follow-up visits as told by your health care provider. This is important. Contact a health care provider if:  You have a fever.  Your rash does not go away after 3-4 days.  Your rash gets worse or it is very itchy.  Your rash has pus or fluid coming from it. Get help right away if:  You are dizzy or nauseated.  You feel confused.  You have trouble breathing.  You have chest pain.  You have muscle cramps or contractions.  You faint. Summary  Heat rash is an itchy rash of little red bumps that often occurs during hot, humid weather.  Symptoms of heat rash include small red bumps that are itchy or prickly and very little or no sweating in the affected area.  This condition is diagnosed based on your symptoms and medical history, as well as a physical exam.  Moving to a cool, dry place is the best treatment for heat rash.  Do not wear tight clothes. Wear comfortable, loose-fitting clothing. This information is not intended to replace advice given to you by your health care provider. Make sure you discuss any questions you have with your health care provider. Document Released: 07/29/2009 Document Revised: 10/21/2016 Document Reviewed: 10/21/2016 Elsevier Interactive Patient Education  2018 [employer]  

## 2017-11-29 NOTE — Progress Notes (Signed)
Subjective:    Patient ID: Cody Knapp, male    DOB: 1960/11/10, 57 y.o.   MRN: [id number]  HPI  57 year old patient who presents with a chief complaint of a rash in the groin area.  He initially tried some OTC Tinactin which was not very helpful.  He was prescribed ketoconazole at work but continues to have some redness and itching in the intertriginous area of the groin as well as scrotal skin.  He is very active with gym activities.  Past Medical History:  Diagnosis Date  . Allergy   . Personal history of colonic adenoma 08/10/2013     Social History   Socioeconomic History  . Marital status: Married    Spouse name: Not on file  . Number of children: Not on file  . Years of education: Not on file  . Highest education level: Not on file  Occupational History  . Not on file  Social Needs  . Financial resource strain: Not on file  . Food insecurity:    Worry: Not on file    Inability: Not on file  . Transportation needs:    Medical: Not on file    Non-medical: Not on file  Tobacco Use  . Smoking status: Never Smoker  . Smokeless tobacco: Never Used  Substance and Sexual Activity  . Alcohol use: Yes  . Drug use: Not on file  . Sexual activity: Not on file  Lifestyle  . Physical activity:    Days per week: Not on file    Minutes per session: Not on file  . Stress: Not on file  Relationships  . Social connections:    Talks on phone: Not on file    Gets together: Not on file    Attends religious service: Not on file    Active member of club or organization: Not on file    Attends meetings of clubs or organizations: Not on file    Relationship status: Not on file  . Intimate partner violence:    Fear of current or ex partner: Not on file    Emotionally abused: Not on file    Physically abused: Not on file    Forced sexual activity: Not on file  Other Topics Concern  . Not on file  Social History Narrative  . Not on file    Past Surgical History:    Procedure Laterality Date  . KNEE ARTHROSCOPY      Family History  Problem Relation Age of Onset  . Heart disease Father        aortic vavle disease (replaced)  . Colon cancer Neg Hx   . Pancreatic cancer Neg Hx   . Stomach cancer Neg Hx     No Known Allergies  Current Outpatient Medications on File Prior to Visit  Medication Sig Dispense Refill  . fexofenadine (ALLEGRA) 180 MG tablet Take 1 tablet (180 mg total) by mouth daily. 30 tablet 11  . zolpidem (AMBIEN) 10 MG tablet TAKE 1/2 TO 1 TABLET AT BEDTIME AS NEEDED FOR SLEEEP 40 tablet 0   No current facility-administered medications on file prior to visit.     BP 120/78 (BP Location: Right Arm, Patient Position: Sitting, Cuff Size: Normal)   Pulse 78   Temp 97.8 F (36.6 C) (Oral)   Wt 175 lb (79.4 kg)   SpO2 96%   BMI 25.84 kg/m     Review of Systems  Skin: Positive for rash.       Objective:  Physical Exam  Constitutional: He appears well-developed and well-nourished. No distress.  Skin: There is erythema.  Very mild erythema involving the intertriginous area in the groin          Assessment & Plan:   Heat rash.  Local measures discussed.  Will treat with short-term triamcinolone cream  Cody Knapp

## 2018-01-18 DIAGNOSIS — L719 Rosacea, unspecified: Secondary | ICD-10-CM | POA: Diagnosis not present

## 2018-06-06 DIAGNOSIS — Z23 Encounter for immunization: Secondary | ICD-10-CM | POA: Diagnosis not present

## 2018-06-09 ENCOUNTER — Encounter: Payer: BLUE CROSS/BLUE SHIELD | Admitting: Internal Medicine

## 2018-06-23 ENCOUNTER — Telehealth: Payer: Self-pay | Admitting: *Deleted

## 2018-06-23 DIAGNOSIS — Z1211 Encounter for screening for malignant neoplasm of colon: Secondary | ICD-10-CM

## 2018-06-23 NOTE — Telephone Encounter (Signed)
Copied from [place] [id number]. Topic: Referral - Request for Referral >> Jun 23, 2018  4:18 PM Yvette Rack wrote: Has patient seen PCP for this complaint? no *If NO, is insurance requiring patient see PCP for this issue before PCP can refer them? No  Referral for which specialty: Pt request referral for colonoscopy Preferred provider/office: no specific provider Reason for referral: Pt request referral for colonoscopy

## 2018-06-24 ENCOUNTER — Encounter: Payer: Self-pay | Admitting: Internal Medicine

## 2018-06-24 NOTE — Telephone Encounter (Signed)
Referral for GI placed for colonoscopy! No further action needed!

## 2018-07-04 DIAGNOSIS — Z008 Encounter for other general examination: Secondary | ICD-10-CM | POA: Diagnosis not present

## 2018-07-20 ENCOUNTER — Ambulatory Visit (INDEPENDENT_AMBULATORY_CARE_PROVIDER_SITE_OTHER): Payer: BLUE CROSS/BLUE SHIELD | Admitting: Adult Health

## 2018-07-20 ENCOUNTER — Encounter: Payer: Self-pay | Admitting: Adult Health

## 2018-07-20 VITALS — BP 114/78 | Temp 98.2°F | Ht 69.75 in | Wt 173.0 lb

## 2018-07-20 DIAGNOSIS — Z125 Encounter for screening for malignant neoplasm of prostate: Secondary | ICD-10-CM

## 2018-07-20 DIAGNOSIS — Z760 Encounter for issue of repeat prescription: Secondary | ICD-10-CM

## 2018-07-20 DIAGNOSIS — Z0000 Encounter for general adult medical examination without abnormal findings: Secondary | ICD-10-CM | POA: Diagnosis not present

## 2018-07-20 DIAGNOSIS — Z23 Encounter for immunization: Secondary | ICD-10-CM

## 2018-07-20 LAB — CBC WITH DIFFERENTIAL/PLATELET
BASOS ABS: 0 10*3/uL (ref 0.0–0.1)
BASOS PCT: 1 % (ref 0.0–3.0)
EOS ABS: 0.2 10*3/uL (ref 0.0–0.7)
Eosinophils Relative: 5.1 % — ABNORMAL HIGH (ref 0.0–5.0)
HCT: 46.1 % (ref 39.0–52.0)
HEMOGLOBIN: 15.9 g/dL (ref 13.0–17.0)
Lymphocytes Relative: 25.8 % (ref 12.0–46.0)
Lymphs Abs: 1 10*3/uL (ref 0.7–4.0)
MCHC: 34.5 g/dL (ref 30.0–36.0)
MCV: 94 fl (ref 78.0–100.0)
MONO ABS: 0.5 10*3/uL (ref 0.1–1.0)
Monocytes Relative: 12.6 % — ABNORMAL HIGH (ref 3.0–12.0)
NEUTROS ABS: 2.1 10*3/uL (ref 1.4–7.7)
Neutrophils Relative %: 55.5 % (ref 43.0–77.0)
Platelets: 138 10*3/uL — ABNORMAL LOW (ref 150.0–400.0)
RBC: 4.9 Mil/uL (ref 4.22–5.81)
RDW: 13.6 % (ref 11.5–15.5)
WBC: 3.7 10*3/uL — ABNORMAL LOW (ref 4.0–10.5)

## 2018-07-20 LAB — COMPREHENSIVE METABOLIC PANEL
ALBUMIN: 4.5 g/dL (ref 3.5–5.2)
ALK PHOS: 64 U/L (ref 39–117)
ALT: 19 U/L (ref 0–53)
AST: 21 U/L (ref 0–37)
BUN: 19 mg/dL (ref 6–23)
CO2: 33 mEq/L — ABNORMAL HIGH (ref 19–32)
CREATININE: 1.13 mg/dL (ref 0.40–1.50)
Calcium: 9.3 mg/dL (ref 8.4–10.5)
Chloride: 103 mEq/L (ref 96–112)
GFR: 71.36 mL/min (ref >60.00)
Glucose, Bld: 81 mg/dL (ref 70–99)
Potassium: 3.9 mEq/L (ref 3.5–5.1)
SODIUM: 141 meq/L (ref 135–145)
TOTAL PROTEIN: 6.6 g/dL (ref 6.0–8.3)
Total Bilirubin: 0.8 mg/dL (ref 0.2–1.2)

## 2018-07-20 LAB — LIPID PANEL
CHOLESTEROL: 150 mg/dL (ref 0–200)
HDL: 53.7 mg/dL (ref >39.00)
LDL Cholesterol: 87 mg/dL (ref 0–99)
NonHDL: 96.22
Total CHOL/HDL Ratio: 3
Triglycerides: 44 mg/dL (ref 0.0–149.0)
VLDL: 8.8 mg/dL (ref 0.0–40.0)

## 2018-07-20 LAB — TSH: TSH: 2.9 u[IU]/mL (ref 0.35–4.50)

## 2018-07-20 LAB — PSA: PSA: 0.88 ng/mL (ref 0.10–4.00)

## 2018-07-20 MED ORDER — ZOLPIDEM TARTRATE 10 MG PO TABS: ORAL_TABLET | ORAL | 1 refills | Status: DC

## 2018-07-20 NOTE — Patient Instructions (Signed)
It was great meeting you today   We will follow up with you regarding your blood work   Continue to eat healthy and exercise

## 2018-07-20 NOTE — Addendum Note (Signed)
Addended by: Miles Costain T on: 07/20/2018 07:42 AM   Modules accepted: Orders

## 2018-07-20 NOTE — Progress Notes (Signed)
Patient presents to clinic today to establish care. He is a pleasant 57 year old male who  has a past medical history of Allergy and Personal history of colonic adenoma (08/10/2013).   He is a former patient of Dr. Raliegh Ip whos last CPE was in 05/2017    Acute Concerns: Establish Care/CPE   Chronic Issues: Seasonal Allergies - controlled with Allegra 180 mg tabs  Insomnia - takes Ambien PRN. Last refill was 8 months ago   Rosacea - is followed by Dermatology   Health Maintenance: Dental -- routine  Vision -- routine  Immunizations -- utd Colonoscopy -- Due next month - h/o polyps  Diet: Eats healthy.  Exercise: Aerobic exercise 3 x a week   Past Medical History:  Diagnosis Date  . Allergy   . Personal history of colonic adenoma 08/10/2013    Past Surgical History:  Procedure Laterality Date  . KNEE ARTHROSCOPY      Current Outpatient Medications on File Prior to Visit  Medication Sig Dispense Refill  . fexofenadine (ALLEGRA) 180 MG tablet Take 1 tablet (180 mg total) by mouth daily. 30 tablet 11  . triamcinolone cream (KENALOG) 0.1 % Apply 1 application topically 2 (two) times daily. 45 g 1  . zolpidem (AMBIEN) 10 MG tablet TAKE 1/2 TO 1 TABLET AT BEDTIME AS NEEDED FOR SLEEEP 40 tablet 0   No current facility-administered medications on file prior to visit.     No Known Allergies  Family History  Problem Relation Age of Onset  . Heart disease Father        aortic vavle disease (replaced)  . Colon cancer Neg Hx   . Pancreatic cancer Neg Hx   . Stomach cancer Neg Hx     Social History   Socioeconomic History  . Marital status: Married    Spouse name: Not on file  . Number of children: Not on file  . Years of education: Not on file  . Highest education level: Not on file  Occupational History  . Not on file  Social Needs  . Financial resource strain: Not on file  . Food insecurity:    Worry: Not on file    Inability: Not on file  . Transportation  needs:    Medical: Not on file    Non-medical: Not on file  Tobacco Use  . Smoking status: Never Smoker  . Smokeless tobacco: Never Used  Substance and Sexual Activity  . Alcohol use: Yes  . Drug use: Not on file  . Sexual activity: Not on file  Lifestyle  . Physical activity:    Days per week: Not on file    Minutes per session: Not on file  . Stress: Not on file  Relationships  . Social connections:    Talks on phone: Not on file    Gets together: Not on file    Attends religious service: Not on file    Active member of club or organization: Not on file    Attends meetings of clubs or organizations: Not on file    Relationship status: Not on file  . Intimate partner violence:    Fear of current or ex partner: Not on file    Emotionally abused: Not on file    Physically abused: Not on file    Forced sexual activity: Not on file  Other Topics Concern  . Not on file  Social History Narrative  . Not on file    Review of Systems  Constitutional: Negative.   HENT: Negative.   Eyes: Negative.   Respiratory: Negative.   Cardiovascular: Negative.   Gastrointestinal: Negative.   Genitourinary: Negative.   Musculoskeletal: Negative.   Skin: Negative.   Neurological: Negative.   Endo/Heme/Allergies: Negative.   Psychiatric/Behavioral: Negative.   All other systems reviewed and are negative.    BP 114/78   Temp 98.2 F (36.8 C)   Ht 5' 9.75" (1.772 m) Comment: WITHOUT SHOES  Wt 173 lb (78.5 kg)   BMI 25.00 kg/m   Physical Exam  Constitutional: He is oriented to person, place, and time. He appears well-developed and well-nourished. No distress.  HENT:  Head: Normocephalic and atraumatic.  Right Ear: External ear normal.  Left Ear: External ear normal.  Nose: Nose normal.  Mouth/Throat: Oropharynx is clear and moist. No oropharyngeal exudate.  Eyes: Pupils are equal, round, and reactive to light. Conjunctivae and EOM are normal. Right eye exhibits no discharge.  Left eye exhibits no discharge. No scleral icterus.  Neck: Normal range of motion. Neck supple. No JVD present. No tracheal deviation present. No thyromegaly present.  Cardiovascular: Normal rate, regular rhythm, normal heart sounds and intact distal pulses. Exam reveals no gallop and no friction rub.  No murmur heard. Pulmonary/Chest: Effort normal and breath sounds normal. No stridor. No respiratory distress. He has no wheezes. He has no rales. He exhibits no tenderness.  Abdominal: Soft. Bowel sounds are normal. He exhibits no distension and no mass. There is no tenderness. There is no rebound and no guarding. No hernia.  Musculoskeletal: Normal range of motion. He exhibits no edema, tenderness or deformity.  Lymphadenopathy:    He has no cervical adenopathy.  Neurological: He is alert and oriented to person, place, and time. He displays normal reflexes. No cranial nerve deficit or sensory deficit. He exhibits normal muscle tone. Coordination normal.  Skin: Skin is warm and dry. Capillary refill takes less than 2 seconds. No rash noted. He is not diaphoretic. No erythema. No pallor.  Psychiatric: He has a normal mood and affect. His behavior is normal. Judgment and thought content normal.  Nursing note and vitals reviewed.   Assessment/Plan: 1. Routine general medical examination at a health care facility  - CBC with Differential/Platelet - Comprehensive metabolic panel - Lipid panel - TSH  2. Prostate cancer screening  - PSA  3. Medication refill  - zolpidem (AMBIEN) 10 MG tablet; Take 1/2 tab QHS PRN  Dispense: 40 tablet; Refill: 1  Dorothyann Peng, NP

## 2018-07-29 ENCOUNTER — Other Ambulatory Visit: Payer: Self-pay | Admitting: Family Medicine

## 2018-07-29 MED ORDER — TRIAMCINOLONE ACETONIDE 0.1 % EX CREA: 1.0000 "application " | TOPICAL_CREAM | Freq: Two times a day (BID) | CUTANEOUS | 1 refills | Status: DC

## 2018-07-29 NOTE — Telephone Encounter (Signed)
Cody Knapp, received fax from the pharmacy for refills.  Pt has recently established with you.  Please advise.

## 2018-08-09 ENCOUNTER — Encounter: Payer: Self-pay | Admitting: Internal Medicine

## 2018-08-09 ENCOUNTER — Ambulatory Visit (AMBULATORY_SURGERY_CENTER): Payer: Self-pay | Admitting: *Deleted

## 2018-08-09 VITALS — Ht 70.0 in | Wt 176.0 lb

## 2018-08-09 DIAGNOSIS — Z86010 Personal history of colonic polyps: Secondary | ICD-10-CM

## 2018-08-09 NOTE — Progress Notes (Signed)
No egg or soy allergy known to patient  No issues with past sedation with any surgeries  or procedures, no past intubation   No diet pills per patient No home 02 use per patient  No blood thinners per patient  Pt denies issues with constipation  No A fib or A flutter  EMMI video sent to pt's e mail  -

## 2018-08-23 ENCOUNTER — Encounter: Payer: Self-pay | Admitting: Internal Medicine

## 2018-08-23 ENCOUNTER — Other Ambulatory Visit: Payer: Self-pay

## 2018-08-23 ENCOUNTER — Ambulatory Visit (AMBULATORY_SURGERY_CENTER): Payer: BLUE CROSS/BLUE SHIELD | Admitting: Internal Medicine

## 2018-08-23 VITALS — BP 128/85 | HR 70 | Temp 97.1°F | Resp 17 | Ht 70.0 in | Wt 176.0 lb

## 2018-08-23 DIAGNOSIS — D123 Benign neoplasm of transverse colon: Secondary | ICD-10-CM | POA: Diagnosis not present

## 2018-08-23 DIAGNOSIS — Z86010 Personal history of colonic polyps: Secondary | ICD-10-CM | POA: Diagnosis not present

## 2018-08-23 DIAGNOSIS — D125 Benign neoplasm of sigmoid colon: Secondary | ICD-10-CM | POA: Diagnosis not present

## 2018-08-23 DIAGNOSIS — D124 Benign neoplasm of descending colon: Secondary | ICD-10-CM | POA: Diagnosis not present

## 2018-08-23 DIAGNOSIS — K635 Polyp of colon: Secondary | ICD-10-CM | POA: Diagnosis not present

## 2018-08-23 MED ORDER — SODIUM CHLORIDE 0.9 % IV SOLN: 500.0000 mL | Freq: Once | INTRAVENOUS | Status: DC

## 2018-08-23 NOTE — Patient Instructions (Addendum)
   I found and removed 3 tiny polyps today.  I will let you know pathology results and when to have another routine colonoscopy by mail and/or My Chart.  I appreciate the opportunity to care for you. Gatha Mayer, MD, FACG  YOU HAD AN ENDOSCOPIC PROCEDURE TODAY AT [facility]:   Refer to the procedure report that was given to you for any specific questions about what was found during the examination.  If the procedure report does not answer your questions, please call your gastroenterologist to clarify.  If you requested that your care partner not be given the details of your procedure findings, then the procedure report has been included in a sealed envelope for you to review at your convenience later.  YOU SHOULD EXPECT: Some feelings of bloating in the abdomen. Passage of more gas than usual.  Walking can help get rid of the air that was put into your GI tract during the procedure and reduce the bloating. If you had a lower endoscopy (such as a colonoscopy or flexible sigmoidoscopy) you may notice spotting of blood in your stool or on the toilet paper. If you underwent a bowel prep for your procedure, you may not have a normal bowel movement for a few days.  Please Note:  You might notice some irritation and congestion in your nose or some drainage.  This is from the oxygen used during your procedure.  There is no need for concern and it should clear up in a day or so.  SYMPTOMS TO REPORT IMMEDIATELY:   Following lower endoscopy (colonoscopy or flexible sigmoidoscopy):  Excessive amounts of blood in the stool  Significant tenderness or worsening of abdominal pains  Swelling of the abdomen that is new, acute  Fever of 100F or higher   For urgent or emergent issues, a gastroenterologist can be reached at any hour by calling [phone].   DIET:  We do recommend a small meal at first, but then you may proceed to your regular diet.  Drink plenty of fluids but  you should avoid alcoholic beverages for 24 hours.  ACTIVITY:  You should plan to take it easy for the rest of today and you should NOT DRIVE or use heavy machinery until tomorrow (because of the sedation medicines used during the test).    FOLLOW UP: Our staff will call the number listed on your records the next business day following your procedure to check on you and address any questions or concerns that you may have regarding the information given to you following your procedure. If we do not reach you, we will leave a message.  However, if you are feeling well and you are not experiencing any problems, there is no need to return our call.  We will assume that you have returned to your regular daily activities without incident.  If any biopsies were taken you will be contacted by phone or by letter within the next 1-3 weeks.  Please call us at [phone] if you have not heard about the biopsies in 3 weeks.    SIGNATURES/CONFIDENTIALITY: You and/or your care partner have signed paperwork which will be entered into your electronic medical record.  These signatures attest to the fact that that the information above on your After Visit Summary has been reviewed and is understood.  Full responsibility of the confidentiality of this discharge information lies with you and/or your care-partner.

## 2018-08-23 NOTE — Progress Notes (Signed)
Called to room to assist during endoscopic procedure.  Patient ID and intended procedure confirmed with present staff. Received instructions for my participation in the procedure from the performing physician.  

## 2018-08-23 NOTE — Op Note (Signed)
[place] Patient Name: Cody Knapp Procedure Date: 08/23/2018 8:00 AM MRN: [id number] Endoscopist: Gatha Mayer , MD Age: 57 Referring MD:  Date of Birth: 08/30/1960 Gender: Male Account #: [id number] Procedure:                Colonoscopy Indications:              High risk colon cancer surveillance: Personal                            history of sessile serrated colon polyp (less than                            10 mm in size) with no dysplasia, Last colonoscopy:                            2014 Medicines:                Propofol per Anesthesia, Monitored Anesthesia Care Procedure:                Pre-Anesthesia Assessment:                           - Prior to the procedure, a History and Physical                            was performed, and patient medications and                            allergies were reviewed. The patient's tolerance of                            previous anesthesia was also reviewed. The risks                            and benefits of the procedure and the sedation                            options and risks were discussed with the patient.                            All questions were answered, and informed consent                            was obtained. Prior Anticoagulants: The patient has                            taken no previous anticoagulant or antiplatelet                            agents. ASA Grade Assessment: II - A patient with                            mild systemic disease. After reviewing the risks  and benefits, the patient was deemed in                            satisfactory condition to undergo the procedure.                           After obtaining informed consent, the colonoscope                            was passed under direct vision. Throughout the                            procedure, the patient's blood pressure, pulse, and                            oxygen saturations were  monitored continuously. The                            Model CF-HQ190L [id number]) scope was introduced                            through the anus and advanced to the the cecum,                            identified by appendiceal orifice and ileocecal                            valve. The colonoscopy was performed without                            difficulty. The patient tolerated the procedure                            well. The quality of the bowel preparation was                            good. The ileocecal valve, appendiceal orifice, and                            rectum were photographed. The bowel preparation                            used was Miralax. Scope In: 8:05:44 AM Scope Out: 8:27:01 AM Scope Withdrawal Time: 0 hours 17 minutes 43 seconds  Total Procedure Duration: 0 hours 21 minutes 17 seconds  Findings:                 The perianal and digital rectal examinations were                            normal. Pertinent negatives include normal prostate                            (size, shape, and consistency).  Three sessile polyps were found in the sigmoid                            colon, descending colon and transverse colon. The                            polyps were diminutive in size. These polyps were                            removed with a cold snare. Resection and retrieval                            were complete. Verification of patient                            identification for the specimen was done. Estimated                            blood loss was minimal.                           The exam was otherwise without abnormality on                            direct and retroflexion views. Complications:            No immediate complications. Estimated Blood Loss:     Estimated blood loss was minimal. Impression:               - Three diminutive polyps in the sigmoid colon, in                            the descending colon and in  the transverse colon,                            removed with a cold snare. Resected and retrieved.                           - The examination was otherwise normal on direct                            and retroflexion views. Recommendation:           - Patient has a contact number available for                            emergencies. The signs and symptoms of potential                            delayed complications were discussed with the                            patient. Return to normal activities tomorrow.  Written discharge instructions were provided to the                            patient.                           - Resume previous diet.                           - Continue present medications.                           - Repeat colonoscopy is recommended for                            surveillance. The colonoscopy date will be                            determined after pathology results from today's                            exam become available for review. Gatha Mayer, MD 08/23/2018 8:41:12 AM This report has been signed electronically.

## 2018-08-23 NOTE — Progress Notes (Signed)
Report to PACU, RN, vss, BBS= Clear.  

## 2018-08-25 ENCOUNTER — Telehealth: Payer: Self-pay | Admitting: *Deleted

## 2018-08-25 NOTE — Telephone Encounter (Signed)
  Follow up Call-  Call back number 08/23/2018  Post procedure Call Back phone  # [phone]  Permission to leave phone message Yes  Some recent data might be hidden     Patient questions:  Do you have a fever, pain , or abdominal swelling? No. Pain Score  0 *  Have you tolerated food without any problems? Yes.    Have you been able to return to your normal activities? Yes.    Do you have any questions about your discharge instructions: Diet   No. Medications  No. Follow up visit  No.  Do you have questions or concerns about your Care? No.  Actions: * If pain score is 4 or above: No action needed, pain <4.

## 2018-08-25 NOTE — Telephone Encounter (Signed)
First follow up call attempt.  Reached voicemail. Left message to call if any questions or concerns.

## 2018-08-28 ENCOUNTER — Encounter: Payer: Self-pay | Admitting: Internal Medicine

## 2018-08-28 NOTE — Progress Notes (Signed)
3 hyperplastic polyps one right sided Recall 7 yrs

## 2018-09-16 ENCOUNTER — Ambulatory Visit (INDEPENDENT_AMBULATORY_CARE_PROVIDER_SITE_OTHER): Payer: BLUE CROSS/BLUE SHIELD | Admitting: *Deleted

## 2018-09-16 DIAGNOSIS — D225 Melanocytic nevi of trunk: Secondary | ICD-10-CM | POA: Diagnosis not present

## 2018-09-16 DIAGNOSIS — L219 Seborrheic dermatitis, unspecified: Secondary | ICD-10-CM | POA: Diagnosis not present

## 2018-09-16 DIAGNOSIS — L821 Other seborrheic keratosis: Secondary | ICD-10-CM | POA: Diagnosis not present

## 2018-09-16 DIAGNOSIS — L814 Other melanin hyperpigmentation: Secondary | ICD-10-CM | POA: Diagnosis not present

## 2018-09-16 DIAGNOSIS — Z23 Encounter for immunization: Secondary | ICD-10-CM | POA: Diagnosis not present

## 2019-03-27 DIAGNOSIS — J302 Other seasonal allergic rhinitis: Secondary | ICD-10-CM | POA: Diagnosis not present

## 2019-03-27 DIAGNOSIS — L719 Rosacea, unspecified: Secondary | ICD-10-CM | POA: Diagnosis not present

## 2019-03-27 DIAGNOSIS — K635 Polyp of colon: Secondary | ICD-10-CM | POA: Diagnosis not present

## 2019-03-27 DIAGNOSIS — Z008 Encounter for other general examination: Secondary | ICD-10-CM | POA: Diagnosis not present

## 2019-04-18 DIAGNOSIS — Z20828 Contact with and (suspected) exposure to other viral communicable diseases: Secondary | ICD-10-CM | POA: Diagnosis not present

## 2019-06-05 DIAGNOSIS — Z23 Encounter for immunization: Secondary | ICD-10-CM | POA: Diagnosis not present

## 2019-07-25 ENCOUNTER — Other Ambulatory Visit: Payer: Self-pay

## 2019-07-25 ENCOUNTER — Ambulatory Visit (INDEPENDENT_AMBULATORY_CARE_PROVIDER_SITE_OTHER): Payer: BC Managed Care – PPO | Admitting: Adult Health

## 2019-07-25 ENCOUNTER — Encounter: Payer: Self-pay | Admitting: Adult Health

## 2019-07-25 VITALS — BP 118/76 | Temp 96.7°F | Wt 179.0 lb

## 2019-07-25 DIAGNOSIS — Z125 Encounter for screening for malignant neoplasm of prostate: Secondary | ICD-10-CM | POA: Diagnosis not present

## 2019-07-25 DIAGNOSIS — Z0000 Encounter for general adult medical examination without abnormal findings: Secondary | ICD-10-CM

## 2019-07-25 DIAGNOSIS — Z760 Encounter for issue of repeat prescription: Secondary | ICD-10-CM

## 2019-07-25 DIAGNOSIS — L719 Rosacea, unspecified: Secondary | ICD-10-CM

## 2019-07-25 DIAGNOSIS — J301 Allergic rhinitis due to pollen: Secondary | ICD-10-CM

## 2019-07-25 DIAGNOSIS — F5101 Primary insomnia: Secondary | ICD-10-CM

## 2019-07-25 LAB — LIPID PANEL
Cholesterol: 159 mg/dL (ref 0–200)
HDL: 49.2 mg/dL (ref >39.00)
LDL Cholesterol: 96 mg/dL (ref 0–99)
NonHDL: 109.39
Total CHOL/HDL Ratio: 3
Triglycerides: 69 mg/dL (ref 0.0–149.0)
VLDL: 13.8 mg/dL (ref 0.0–40.0)

## 2019-07-25 LAB — COMPREHENSIVE METABOLIC PANEL
ALT: 20 U/L (ref 0–53)
AST: 21 U/L (ref 0–37)
Albumin: 4.1 g/dL (ref 3.5–5.2)
Alkaline Phosphatase: 60 U/L (ref 39–117)
BUN: 16 mg/dL (ref 6–23)
CO2: 30 mEq/L (ref 19–32)
Calcium: 8.9 mg/dL (ref 8.4–10.5)
Chloride: 104 mEq/L (ref 96–112)
Creatinine, Ser: 1.03 mg/dL (ref 0.40–1.50)
GFR: 74.45 mL/min (ref >60.00)
Glucose, Bld: 87 mg/dL (ref 70–99)
Potassium: 3.9 mEq/L (ref 3.5–5.1)
Sodium: 141 mEq/L (ref 135–145)
Total Bilirubin: 0.7 mg/dL (ref 0.2–1.2)
Total Protein: 6 g/dL (ref 6.0–8.3)

## 2019-07-25 LAB — CBC WITH DIFFERENTIAL/PLATELET
Basophils Absolute: 0 10*3/uL (ref 0.0–0.1)
Basophils Relative: 0.7 % (ref 0.0–3.0)
Eosinophils Absolute: 0.3 10*3/uL (ref 0.0–0.7)
Eosinophils Relative: 6.6 % — ABNORMAL HIGH (ref 0.0–5.0)
HCT: 44.7 % (ref 39.0–52.0)
Hemoglobin: 15.4 g/dL (ref 13.0–17.0)
Lymphocytes Relative: 27.5 % (ref 12.0–46.0)
Lymphs Abs: 1.1 10*3/uL (ref 0.7–4.0)
MCHC: 34.5 g/dL (ref 30.0–36.0)
MCV: 93.9 fl (ref 78.0–100.0)
Monocytes Absolute: 0.5 10*3/uL (ref 0.1–1.0)
Monocytes Relative: 12.1 % — ABNORMAL HIGH (ref 3.0–12.0)
Neutro Abs: 2.2 10*3/uL (ref 1.4–7.7)
Neutrophils Relative %: 53.1 % (ref 43.0–77.0)
Platelets: 125 10*3/uL — ABNORMAL LOW (ref 150.0–400.0)
RBC: 4.76 Mil/uL (ref 4.22–5.81)
RDW: 13.3 % (ref 11.5–15.5)
WBC: 4.1 10*3/uL (ref 4.0–10.5)

## 2019-07-25 LAB — TSH: TSH: 2.82 u[IU]/mL (ref 0.35–4.50)

## 2019-07-25 LAB — PSA: PSA: 0.92 ng/mL (ref 0.10–4.00)

## 2019-07-25 MED ORDER — ZOLPIDEM TARTRATE 10 MG PO TABS: ORAL_TABLET | ORAL | 2 refills | Status: DC

## 2019-07-25 MED ORDER — TRIAMCINOLONE ACETONIDE 0.1 % EX CREA: 1.0000 "application " | TOPICAL_CREAM | Freq: Two times a day (BID) | CUTANEOUS | 1 refills | Status: DC

## 2019-07-25 NOTE — Progress Notes (Signed)
Subjective:    Patient ID: Cody Knapp, male    DOB: 09-Aug-1961, 58 y.o.   MRN: [id number]  HPI Patient presents for yearly preventative medicine examination. He is a pleasant 58 year old male who  has a past medical history of Allergy, Personal history of colonic adenoma (08/10/2013), and Rosacea.  Seasonal Allergies - controlled with Allegra 180 mg tabs   Imsomnia - takes Ambien PRN. His last refill was 8 months ago.   Rosacea - Is followed by Dermatology    All immunizations and health maintenance protocols were reviewed with the patient and needed orders were placed. UTD on vaccinations. Had flu and shingles vaccination series this year.   Appropriate screening laboratory values were ordered for the patient including screening of hyperlipidemia, renal function and hepatic function. If indicated by BPH, a PSA was ordered.  Medication reconciliation,  past medical history, social history, problem list and allergies were reviewed in detail with the patient  Goals were established with regard to weight loss, exercise, and  diet in compliance with medications.  He eats a heart healthy diet and does some form of aerobic exercise at least 3 times a week.  Wt Readings from Last 3 Encounters:  07/25/19 179 lb (81.2 kg)  08/23/18 176 lb (79.8 kg)  08/09/18 176 lb (79.8 kg)    He is up-to-date on routine dental and vision screens.  Last colonoscopy was in 2019 and he is on the 7-year plan.  Review of Systems  Constitutional: Negative.   HENT: Negative.   Eyes: Negative.   Respiratory: Negative.   Cardiovascular: Negative.   Gastrointestinal: Negative.   Endocrine: Negative.   Genitourinary: Negative.   Musculoskeletal: Negative.   Skin: Negative.   Allergic/Immunologic: Negative.   Neurological: Negative.   Hematological: Negative.   Psychiatric/Behavioral: Negative.   All other systems reviewed and are negative.  Past Medical History:  Diagnosis Date  . Allergy   .  Personal history of colonic adenoma 08/10/2013  . Rosacea     Social History   Socioeconomic History  . Marital status: Married    Spouse name: Not on file  . Number of children: Not on file  . Years of education: Not on file  . Highest education level: Not on file  Occupational History  . Not on file  Social Needs  . Financial resource strain: Not on file  . Food insecurity    Worry: Not on file    Inability: Not on file  . Transportation needs    Medical: Not on file    Non-medical: Not on file  Tobacco Use  . Smoking status: Never Smoker  . Smokeless tobacco: Never Used  Substance and Sexual Activity  . Alcohol use: Yes  . Drug use: Never  . Sexual activity: Not on file  Lifestyle  . Physical activity    Days per week: Not on file    Minutes per session: Not on file  . Stress: Not on file  Relationships  . Social Herbalist on phone: Not on file    Gets together: Not on file    Attends religious service: Not on file    Active member of club or organization: Not on file    Attends meetings of clubs or organizations: Not on file    Relationship status: Not on file  . Intimate partner violence    Fear of current or ex partner: Not on file    Emotionally abused: Not on  file    Physically abused: Not on file    Forced sexual activity: Not on file  Other Topics Concern  . Not on file  Social History Narrative   Works in [employer]    Married    Three daughters ( 23, 19,16)     Past Surgical History:  Procedure Laterality Date  . COLONOSCOPY    . KNEE ARTHROSCOPY Right     Family History  Problem Relation Age of Onset  . Heart disease Father        aortic vavle disease (replaced)  . Colon polyps Sister   . Colon cancer Neg Hx   . Pancreatic cancer Neg Hx   . Esophageal cancer Neg Hx   . Rectal cancer Neg Hx   . Stomach cancer Neg Hx     No Known Allergies  Current Outpatient Medications on File Prior to Visit  Medication Sig Dispense  Refill  . Doxylamine Succinate, Sleep, (SLEEP AID PO) Take by mouth.    . fexofenadine (ALLEGRA) 180 MG tablet Take 1 tablet (180 mg total) by mouth daily. 30 tablet 11  . fluticasone (FLONASE) 50 MCG/ACT nasal spray Place into the nose.     No current facility-administered medications on file prior to visit.     BP 118/76   Temp (!) 96.7 F (35.9 C)   Wt 179 lb (81.2 kg)   BMI 25.68 kg/m       Objective:   Physical Exam Vitals signs and nursing note reviewed.  Constitutional:      General: He is not in acute distress.    Appearance: Normal appearance. He is well-developed.  HENT:     Head: Normocephalic and atraumatic.     Right Ear: Tympanic membrane, ear canal and external ear normal. There is no impacted cerumen.     Left Ear: Tympanic membrane, ear canal and external ear normal. There is no impacted cerumen.     Nose: Nose normal. No congestion or rhinorrhea.     Mouth/Throat:     Mouth: Mucous membranes are moist.     Pharynx: Oropharynx is clear. No oropharyngeal exudate.  Eyes:     General:        Right eye: No discharge.        Left eye: No discharge.     Extraocular Movements: Extraocular movements intact.     Pupils: Pupils are equal, round, and reactive to light.  Neck:     Trachea: No tracheal deviation.  Cardiovascular:     Rate and Rhythm: Normal rate and regular rhythm.     Pulses: Normal pulses.     Heart sounds: Normal heart sounds. No murmur. No friction rub. No gallop.   Pulmonary:     Effort: Pulmonary effort is normal. No respiratory distress.     Breath sounds: Normal breath sounds. No stridor. No wheezing, rhonchi or rales.  Chest:     Chest wall: No tenderness.  Abdominal:     General: Bowel sounds are normal. There is no distension.     Palpations: Abdomen is soft. There is no mass.     Tenderness: There is no abdominal tenderness. There is no right CVA tenderness, left CVA tenderness, guarding or rebound.     Hernia: No hernia is present.   Musculoskeletal: Normal range of motion.        General: No swelling, tenderness, deformity or signs of injury.     Right lower leg: No edema.     Left  lower leg: No edema.  Lymphadenopathy:     Cervical: No cervical adenopathy.  Skin:    General: Skin is warm and dry.     Capillary Refill: Capillary refill takes less than 2 seconds.     Coloration: Skin is not jaundiced or pale.     Findings: No bruising, erythema, lesion or rash.  Neurological:     General: No focal deficit present.     Mental Status: He is alert and oriented to person, place, and time. Mental status is at baseline.     Cranial Nerves: No cranial nerve deficit.     Sensory: No sensory deficit.     Motor: No weakness.     Coordination: Coordination normal.     Gait: Gait normal.     Deep Tendon Reflexes: Reflexes normal.  Psychiatric:        Mood and Affect: Mood normal.        Behavior: Behavior normal.        Thought Content: Thought content normal.        Judgment: Judgment normal.       Assessment & Plan:  1. Routine general medical examination at a health care facility - Continue to eat healthy and exercise - Follow up in one year or sooner if needed - CBC with Differential/Platelet - CMP - Lipid panel - TSH  2. Prostate cancer screening  - PSA  3. Seasonal allergic rhinitis due to pollen - Continue with Allegra   4. Rosacea  - triamcinolone cream (KENALOG) 0.1 %; Apply 1 application topically 2 (two) times daily.  Dispense: 45 g; Refill: 1   5. Primary insomnia - zolpidem (AMBIEN) 10 MG tablet; Take 1/2 tab QHS PRN  Dispense: 30 tablet; Refill: 2   Dorothyann Peng, NP

## 2019-09-21 DIAGNOSIS — D225 Melanocytic nevi of trunk: Secondary | ICD-10-CM | POA: Diagnosis not present

## 2019-09-21 DIAGNOSIS — L821 Other seborrheic keratosis: Secondary | ICD-10-CM | POA: Diagnosis not present

## 2019-09-21 DIAGNOSIS — L719 Rosacea, unspecified: Secondary | ICD-10-CM | POA: Diagnosis not present

## 2019-09-21 DIAGNOSIS — L814 Other melanin hyperpigmentation: Secondary | ICD-10-CM | POA: Diagnosis not present

## 2019-09-21 DIAGNOSIS — L570 Actinic keratosis: Secondary | ICD-10-CM | POA: Diagnosis not present

## 2019-10-28 ENCOUNTER — Ambulatory Visit: Payer: Self-pay | Attending: Internal Medicine

## 2019-10-28 DIAGNOSIS — Z23 Encounter for immunization: Secondary | ICD-10-CM | POA: Insufficient documentation

## 2019-10-28 NOTE — Progress Notes (Signed)
   Covid-19 Vaccination Clinic  Name:  Cody Knapp    MRN: [id number] DOB: 09-25-1960  10/28/2019  Cody Knapp was observed post Covid-19 immunization for 15 minutes without incident. He was provided with Vaccine Information Sheet and instruction to access the V-Safe system.   Cody Knapp was instructed to call 911 with any severe reactions post vaccine: [name] Kitchen Difficulty breathing  . Swelling of face and throat  . A fast heartbeat  . A bad rash all over body  . Dizziness and weakness   Immunizations Administered    Name Date Dose VIS Date Route   Pfizer COVID-19 Vaccine 10/28/2019  9:26 AM 0.3 mL 08/04/2019 Intramuscular   Manufacturer: [place]   Lot: [id number]   [place]: [id number]

## 2019-11-27 ENCOUNTER — Ambulatory Visit: Payer: Self-pay

## 2019-11-28 ENCOUNTER — Ambulatory Visit: Payer: Self-pay

## 2019-12-11 ENCOUNTER — Ambulatory Visit: Payer: Self-pay | Attending: Internal Medicine

## 2019-12-11 DIAGNOSIS — Z23 Encounter for immunization: Secondary | ICD-10-CM

## 2019-12-11 NOTE — Progress Notes (Signed)
   Covid-19 Vaccination Clinic  Name:  Cody Knapp    MRN: [id number] DOB: 1960/12/12  12/11/2019  [name] was observed post Covid-19 immunization for 15 minutes without incident. He was provided with Vaccine Information Sheet and instruction to access the V-Safe system.   [name] was instructed to call 911 with any severe reactions post vaccine: [name] Kitchen Difficulty breathing  . Swelling of face and throat  . A fast heartbeat  . A bad rash all over body  . Dizziness and weakness   Immunizations Administered    Name Date Dose VIS Date Route   Pfizer COVID-19 Vaccine 12/11/2019 11:57 AM 0.3 mL 10/18/2018 Intramuscular   Manufacturer: [place]   Lot: [id number]   [place]: [id number]

## 2020-06-03 DIAGNOSIS — Z23 Encounter for immunization: Secondary | ICD-10-CM | POA: Diagnosis not present

## 2020-06-05 ENCOUNTER — Other Ambulatory Visit: Payer: Self-pay | Admitting: Adult Health

## 2020-06-05 DIAGNOSIS — F5101 Primary insomnia: Secondary | ICD-10-CM

## 2020-06-05 MED ORDER — ZOLPIDEM TARTRATE 10 MG PO TABS: ORAL_TABLET | ORAL | 2 refills | Status: DC

## 2020-06-05 NOTE — Telephone Encounter (Signed)
Pt is calling in almost out of Rx zolpidem (AMBIEN) 10 MG  Pharm:  [facility] on [address]

## 2020-07-16 ENCOUNTER — Other Ambulatory Visit: Payer: Self-pay | Admitting: Adult Health

## 2020-07-16 DIAGNOSIS — L719 Rosacea, unspecified: Secondary | ICD-10-CM

## 2020-07-31 ENCOUNTER — Encounter: Payer: Self-pay | Admitting: Adult Health

## 2020-08-01 ENCOUNTER — Encounter: Payer: Self-pay | Admitting: Adult Health

## 2020-08-01 ENCOUNTER — Ambulatory Visit (INDEPENDENT_AMBULATORY_CARE_PROVIDER_SITE_OTHER): Payer: BC Managed Care – PPO | Admitting: Adult Health

## 2020-08-01 ENCOUNTER — Other Ambulatory Visit: Payer: Self-pay

## 2020-08-01 VITALS — BP 120/80 | HR 68 | Temp 98.2°F | Ht 69.75 in | Wt 176.6 lb

## 2020-08-01 DIAGNOSIS — F5101 Primary insomnia: Secondary | ICD-10-CM

## 2020-08-01 DIAGNOSIS — Z23 Encounter for immunization: Secondary | ICD-10-CM | POA: Diagnosis not present

## 2020-08-01 DIAGNOSIS — J301 Allergic rhinitis due to pollen: Secondary | ICD-10-CM

## 2020-08-01 DIAGNOSIS — Z0000 Encounter for general adult medical examination without abnormal findings: Secondary | ICD-10-CM

## 2020-08-01 DIAGNOSIS — L719 Rosacea, unspecified: Secondary | ICD-10-CM

## 2020-08-01 DIAGNOSIS — Z125 Encounter for screening for malignant neoplasm of prostate: Secondary | ICD-10-CM | POA: Diagnosis not present

## 2020-08-01 DIAGNOSIS — Z1159 Encounter for screening for other viral diseases: Secondary | ICD-10-CM

## 2020-08-01 DIAGNOSIS — L299 Pruritus, unspecified: Secondary | ICD-10-CM

## 2020-08-01 LAB — TSH: TSH: 2.48 u[IU]/mL (ref 0.35–4.50)

## 2020-08-01 LAB — CBC WITH DIFFERENTIAL/PLATELET
Basophils Absolute: 0 10*3/uL (ref 0.0–0.1)
Basophils Relative: 0.8 % (ref 0.0–3.0)
Eosinophils Absolute: 0.3 10*3/uL (ref 0.0–0.7)
Eosinophils Relative: 6.8 % — ABNORMAL HIGH (ref 0.0–5.0)
HCT: 44.1 % (ref 39.0–52.0)
Hemoglobin: 15.2 g/dL (ref 13.0–17.0)
Lymphocytes Relative: 22 % (ref 12.0–46.0)
Lymphs Abs: 0.9 10*3/uL (ref 0.7–4.0)
MCHC: 34.4 g/dL (ref 30.0–36.0)
MCV: 93.5 fl (ref 78.0–100.0)
Monocytes Absolute: 0.4 10*3/uL (ref 0.1–1.0)
Monocytes Relative: 10.8 % (ref 3.0–12.0)
Neutro Abs: 2.3 10*3/uL (ref 1.4–7.7)
Neutrophils Relative %: 59.6 % (ref 43.0–77.0)
Platelets: 127 10*3/uL — ABNORMAL LOW (ref 150.0–400.0)
RBC: 4.72 Mil/uL (ref 4.22–5.81)
RDW: 13.4 % (ref 11.5–15.5)
WBC: 3.9 10*3/uL — ABNORMAL LOW (ref 4.0–10.5)

## 2020-08-01 LAB — COMPREHENSIVE METABOLIC PANEL
ALT: 19 U/L (ref 0–53)
AST: 23 U/L (ref 0–37)
Albumin: 4.3 g/dL (ref 3.5–5.2)
Alkaline Phosphatase: 62 U/L (ref 39–117)
BUN: 11 mg/dL (ref 6–23)
CO2: 32 mEq/L (ref 19–32)
Calcium: 9.2 mg/dL (ref 8.4–10.5)
Chloride: 104 mEq/L (ref 96–112)
Creatinine, Ser: 1.16 mg/dL (ref 0.40–1.50)
GFR: 69.69 mL/min (ref >60.00)
Glucose, Bld: 87 mg/dL (ref 70–99)
Potassium: 3.7 mEq/L (ref 3.5–5.1)
Sodium: 140 mEq/L (ref 135–145)
Total Bilirubin: 0.9 mg/dL (ref 0.2–1.2)
Total Protein: 6.4 g/dL (ref 6.0–8.3)

## 2020-08-01 LAB — LIPID PANEL
Cholesterol: 137 mg/dL (ref 0–200)
HDL: 49.1 mg/dL (ref >39.00)
LDL Cholesterol: 79 mg/dL (ref 0–99)
NonHDL: 88.35
Total CHOL/HDL Ratio: 3
Triglycerides: 49 mg/dL (ref 0.0–149.0)
VLDL: 9.8 mg/dL (ref 0.0–40.0)

## 2020-08-01 LAB — PSA: PSA: 1.12 ng/mL (ref 0.10–4.00)

## 2020-08-01 NOTE — Progress Notes (Signed)
Subjective:    Patient ID: Cody Knapp, male    DOB: [birth year], 59 y.o.   MRN: [id number]  HPI Patient presents for yearly preventative medicine examination. He is a pleasant 59 year old male who  has a past medical history of Allergy, Personal history of colonic adenoma (08/10/2013), and Rosacea.  Seasonal Allergies - takes Allegra 180 mg PRN.   Insomnia - takes Ambien 5mg  PRN   Rosacea - is followed by Dermatology   Chronic Pruritis - has been present over the last six months. Reports that he was seen by Dermatology and they chalked it up to dry skin. He has been applying lotion. Itching is mostly located on the back but can be present throughout the entire body. He has not noticed any rashes. No changes in detergents. Itching will wake him up from sleep or make it hard to go to sleep. Does not endorse any new stress. He has not noticed any improvement with OTC antihistamines   All immunizations and health maintenance protocols were reviewed with the patient and needed orders were placed.  Appropriate screening laboratory values were ordered for the patient including screening of hyperlipidemia, renal function and hepatic function. If indicated by BPH, a PSA was ordered.  Medication reconciliation,  past medical history, social history, problem list and allergies were reviewed in detail with the patient  Goals were established with regard to weight loss, exercise, and  diet in compliance with medications.  He continues to eat a heart healthy diet and does some form of aerobic exercise at least 3 times a week Wt Readings from Last 3 Encounters:  07/25/19 179 lb (81.2 kg)  08/23/18 176 lb (79.8 kg)  08/09/18 176 lb (79.8 kg)     He is up-to-date on routine dental and vision screens.  His last colonoscopy was in 2019 and he is on the 7-year plan   Review of Systems  Constitutional: Negative.   HENT: Negative.   Eyes: Negative.   Respiratory: Negative.   Cardiovascular:  Negative.   Gastrointestinal: Negative.   Endocrine: Negative.   Genitourinary: Negative.   Musculoskeletal: Negative.   Skin: Negative.        Itching    Allergic/Immunologic: Negative.   Neurological: Negative.   Hematological: Negative.   Psychiatric/Behavioral: Negative.   All other systems reviewed and are negative.  Past Medical History:  Diagnosis Date  . Allergy   . Personal history of colonic adenoma 08/10/2013  . Rosacea     Social History   Socioeconomic History  . Marital status: Married    Spouse name: Not on file  . Number of children: Not on file  . Years of education: Not on file  . Highest education level: Not on file  Occupational History  . Not on file  Tobacco Use  . Smoking status: Never Smoker  . Smokeless tobacco: Never Used  Substance and Sexual Activity  . Alcohol use: Yes  . Drug use: Never  . Sexual activity: Not on file  Other Topics Concern  . Not on file  Social History Narrative   Works in [employer]    Married    Three daughters ( 23, 19,16)    Social Determinants of Health   Financial Resource Strain: Not on file  Food Insecurity: Not on file  Transportation Needs: Not on file  Physical Activity: Not on file  Stress: Not on file  Social Connections: Not on file  Intimate Partner Violence: Not on file  Past Surgical History:  Procedure Laterality Date  . COLONOSCOPY    . KNEE ARTHROSCOPY Right     Family History  Problem Relation Age of Onset  . Heart disease Father        aortic vavle disease (replaced)  . Colon polyps Sister   . Colon cancer Neg Hx   . Pancreatic cancer Neg Hx   . Esophageal cancer Neg Hx   . Rectal cancer Neg Hx   . Stomach cancer Neg Hx     No Known Allergies  Current Outpatient Medications on File Prior to Visit  Medication Sig Dispense Refill  . Doxylamine Succinate, Sleep, (SLEEP AID PO) Take by mouth.    . fexofenadine (ALLEGRA) 180 MG tablet Take 1 tablet (180 mg total) by mouth  daily. 30 tablet 11  . fluticasone (FLONASE) 50 MCG/ACT nasal spray Place into the nose.    . triamcinolone (KENALOG) 0.1 % APPLY TO AFFECTED AREA TWICE A DAY 45 g 1  . zolpidem (AMBIEN) 10 MG tablet Take 1/2 tab QHS PRN 30 tablet 2   No current facility-administered medications on file prior to visit.    There were no vitals taken for this visit.      Objective:   Physical Exam Vitals and nursing note reviewed.  Constitutional:      General: He is not in acute distress.    Appearance: Normal appearance. He is well-developed and normal weight.  HENT:     Head: Normocephalic and atraumatic.     Right Ear: Tympanic membrane, ear canal and external ear normal. There is no impacted cerumen.     Left Ear: Tympanic membrane, ear canal and external ear normal. There is no impacted cerumen.     Nose: Nose normal. No congestion or rhinorrhea.     Mouth/Throat:     Mouth: Mucous membranes are moist.     Pharynx: Oropharynx is clear. No oropharyngeal exudate or posterior oropharyngeal erythema.  Eyes:     General:        Right eye: No discharge.        Left eye: No discharge.     Extraocular Movements: Extraocular movements intact.     Conjunctiva/sclera: Conjunctivae normal.     Pupils: Pupils are equal, round, and reactive to light.  Neck:     Vascular: No carotid bruit.     Trachea: No tracheal deviation.  Cardiovascular:     Rate and Rhythm: Normal rate and regular rhythm.     Pulses: Normal pulses.     Heart sounds: Normal heart sounds. No murmur heard. No friction rub. No gallop.   Pulmonary:     Effort: Pulmonary effort is normal. No respiratory distress.     Breath sounds: Normal breath sounds. No stridor. No wheezing, rhonchi or rales.  Chest:     Chest wall: No tenderness.  Abdominal:     General: Bowel sounds are normal. There is no distension.     Palpations: Abdomen is soft. There is no mass.     Tenderness: There is no abdominal tenderness. There is no right CVA  tenderness, left CVA tenderness, guarding or rebound.     Hernia: No hernia is present.  Musculoskeletal:        General: No swelling, tenderness, deformity or signs of injury. Normal range of motion.     Right lower leg: No edema.     Left lower leg: No edema.  Lymphadenopathy:     Cervical: No cervical adenopathy.  Skin:    General: Skin is warm and dry.     Capillary Refill: Capillary refill takes less than 2 seconds.     Coloration: Skin is not jaundiced or pale.     Findings: No bruising, erythema, lesion or rash.     Comments: No rash or dry skin noted  Neurological:     General: No focal deficit present.     Mental Status: He is alert and oriented to person, place, and time.     Cranial Nerves: No cranial nerve deficit.     Sensory: No sensory deficit.     Motor: No weakness.     Coordination: Coordination normal.     Gait: Gait normal.     Deep Tendon Reflexes: Reflexes normal.  Psychiatric:        Mood and Affect: Mood normal.        Behavior: Behavior normal.        Thought Content: Thought content normal.        Judgment: Judgment normal.       Assessment & Plan:  1. Routine general medical examination at a health care facility  - Comprehensive metabolic panel; Future - TSH; Future - Lipid panel; Future - CBC with Differential/Platelet; Future - CBC with Differential/Platelet - Lipid panel - TSH - Comprehensive metabolic panel  2. Prostate cancer screening  - PSA; Future - PSA  3. Seasonal allergic rhinitis due to pollen - Continue with Allegra   4. Rosacea - Follow up with Dermatology as directed  5. Primary insomnia - Continue with Ambien   6. Need for hepatitis C screening test  - Hep C Antibody; Future - Hep C Antibody  7. Need for Tdap vaccination  - Tdap vaccine greater than or equal to 7yo IM  8. Chronic pruritus - Does not appear to be dermatological. Will check labs. Consider low dose SSRI - Comprehensive metabolic panel;  Future - CBC with Differential/Platelet - Lipid panel - TSH - Comprehensive metabolic panel   Dorothyann Peng, NP

## 2020-08-01 NOTE — Patient Instructions (Signed)
It was great seeing you today   I will follow up with you regarding your labs and see if we can figure out a reason why you are having this chronic itching

## 2020-08-02 ENCOUNTER — Other Ambulatory Visit: Payer: Self-pay | Admitting: Adult Health

## 2020-08-02 ENCOUNTER — Telehealth: Payer: Self-pay | Admitting: Adult Health

## 2020-08-02 LAB — HEPATITIS C ANTIBODY
Hepatitis C Ab: NONREACTIVE
SIGNAL TO CUT-OFF: 0.01 (ref <1.00)

## 2020-08-02 MED ORDER — ESCITALOPRAM OXALATE 5 MG PO TABS: 5.0000 mg | ORAL_TABLET | Freq: Every day | ORAL | 0 refills | Status: DC

## 2020-08-02 NOTE — Telephone Encounter (Signed)
Patient is calling back and wanted to get the prescription Lexapro changed to [facility] on [address], please advise. CB is [phone]

## 2020-08-02 NOTE — Telephone Encounter (Signed)
Advised patient to call [facility] and have them call [facility] to have Rx transferred to them.

## 2020-09-25 DIAGNOSIS — L821 Other seborrheic keratosis: Secondary | ICD-10-CM | POA: Diagnosis not present

## 2020-09-25 DIAGNOSIS — L814 Other melanin hyperpigmentation: Secondary | ICD-10-CM | POA: Diagnosis not present

## 2020-09-25 DIAGNOSIS — D225 Melanocytic nevi of trunk: Secondary | ICD-10-CM | POA: Diagnosis not present

## 2020-09-25 DIAGNOSIS — L570 Actinic keratosis: Secondary | ICD-10-CM | POA: Diagnosis not present

## 2020-09-25 DIAGNOSIS — L719 Rosacea, unspecified: Secondary | ICD-10-CM | POA: Diagnosis not present

## 2020-10-04 ENCOUNTER — Other Ambulatory Visit: Payer: Self-pay | Admitting: Adult Health

## 2020-10-04 NOTE — Telephone Encounter (Signed)
Sent to the pharmacy by e-scribe. 

## 2020-12-23 ENCOUNTER — Other Ambulatory Visit: Payer: Self-pay | Admitting: Adult Health

## 2020-12-23 DIAGNOSIS — F5101 Primary insomnia: Secondary | ICD-10-CM

## 2020-12-24 NOTE — Telephone Encounter (Signed)
Pt is calling in to check the status of Rx escitalopram (LEXAPRO) 5 MG and zolpiden (AMBIEN) 10 MG.  Pt is aware that his provider was not in on Monday 12/23/2020.

## 2020-12-25 ENCOUNTER — Other Ambulatory Visit: Payer: Self-pay | Admitting: *Deleted

## 2020-12-25 MED ORDER — ESCITALOPRAM OXALATE 5 MG PO TABS: ORAL_TABLET | ORAL | 1 refills | Status: DC

## 2020-12-25 NOTE — Telephone Encounter (Signed)
Rx done. 

## 2021-01-29 ENCOUNTER — Telehealth: Payer: Self-pay | Admitting: Adult Health

## 2021-01-29 ENCOUNTER — Telehealth: Payer: BC Managed Care – PPO | Admitting: Adult Health

## 2021-01-29 DIAGNOSIS — F5101 Primary insomnia: Secondary | ICD-10-CM

## 2021-01-29 MED ORDER — ZOLPIDEM TARTRATE 10 MG PO TABS: ORAL_TABLET | ORAL | 1 refills | Status: DC

## 2021-01-29 NOTE — Telephone Encounter (Signed)
Message routed to PCP CMA  

## 2021-01-29 NOTE — Telephone Encounter (Signed)
[facility] for refill  Last office visit 08/01/2020

## 2021-01-29 NOTE — Telephone Encounter (Signed)
Pt call and stated he need a refill on zolpidem (AMBIEN) 10 MG tablet sent to [facility] .

## 2021-02-18 ENCOUNTER — Other Ambulatory Visit: Payer: Self-pay

## 2021-02-18 ENCOUNTER — Emergency Department (HOSPITAL_BASED_OUTPATIENT_CLINIC_OR_DEPARTMENT_OTHER): Payer: BC Managed Care – PPO | Admitting: Radiology

## 2021-02-18 ENCOUNTER — Encounter (HOSPITAL_BASED_OUTPATIENT_CLINIC_OR_DEPARTMENT_OTHER): Payer: Self-pay

## 2021-02-18 ENCOUNTER — Emergency Department (HOSPITAL_BASED_OUTPATIENT_CLINIC_OR_DEPARTMENT_OTHER)
Admission: EM | Admit: 2021-02-18 | Discharge: 2021-02-19 | Disposition: A | Discharge status: Home / Self Care | Payer: BC Managed Care – PPO | Attending: Emergency Medicine | Admitting: Emergency Medicine

## 2021-02-18 DIAGNOSIS — S4991XA Unspecified injury of right shoulder and upper arm, initial encounter: Secondary | ICD-10-CM | POA: Diagnosis not present

## 2021-02-18 DIAGNOSIS — Y92318 Other athletic court as the place of occurrence of the external cause: Secondary | ICD-10-CM | POA: Insufficient documentation

## 2021-02-18 DIAGNOSIS — W1839XA Other fall on same level, initial encounter: Secondary | ICD-10-CM | POA: Diagnosis not present

## 2021-02-18 DIAGNOSIS — S43101A Unspecified dislocation of right acromioclavicular joint, initial encounter: Secondary | ICD-10-CM

## 2021-02-18 NOTE — ED Triage Notes (Signed)
Patient here POV from [place] with Right Arm Injury.  Patient dove onto ground and rolled and injured shoulder.  Ice applied at Triage. Ambulatory, [facility] 15.

## 2021-02-19 ENCOUNTER — Encounter (HOSPITAL_BASED_OUTPATIENT_CLINIC_OR_DEPARTMENT_OTHER): Payer: Self-pay | Admitting: Emergency Medicine

## 2021-02-19 DIAGNOSIS — S4351XA Sprain of right acromioclavicular joint, initial encounter: Secondary | ICD-10-CM | POA: Diagnosis not present

## 2021-02-19 DIAGNOSIS — M25511 Pain in right shoulder: Secondary | ICD-10-CM | POA: Diagnosis not present

## 2021-02-19 MED ORDER — MELOXICAM 7.5 MG PO TABS
15.0000 mg | ORAL_TABLET | ORAL | Status: AC
  Administered 2021-02-19: 15 mg via ORAL
  Filled 2021-02-19: qty 2

## 2021-02-19 MED ORDER — DICLOFENAC SODIUM ER 100 MG PO TB24: 100.0000 mg | ORAL_TABLET | Freq: Every day | ORAL | 0 refills | Status: DC

## 2021-02-19 NOTE — ED Provider Notes (Signed)
[facility] EMERGENCY DEPT Provider Note   CSN: [id number] Arrival date & time: 02/18/21  2226     History Chief Complaint  Patient presents with   Shoulder Injury    Right    Cody Knapp is a 60 y.o. male.  The history is provided by the patient.  Shoulder Injury This is a new problem. The current episode started 3 to 5 hours ago. The problem occurs constantly. The problem has not changed since onset.Pertinent negatives include no chest pain, no abdominal pain, no headaches and no shortness of breath. Nothing aggravates the symptoms. Nothing relieves the symptoms. He has tried nothing for the symptoms. The treatment provided no relief.  Patient fell onto the shoulder on a clay court, now with pain.      Past Medical History:  Diagnosis Date   Allergy    Personal history of colonic adenoma 08/10/2013   Rosacea     Patient Active Problem List   Diagnosis Date Noted   History of colonic polyps 08/10/2013   Thrombocytopenia ([place]) 06/18/2010   Leukocytopenia 06/18/2010   ROSACEA 11/02/2007   Allergic rhinitis 05/26/2007   DERMATITIS, SCALP 08/03/2006    Past Surgical History:  Procedure Laterality Date   COLONOSCOPY     KNEE ARTHROSCOPY Right        Family History  Problem Relation Age of Onset   Heart disease Father        aortic vavle disease (replaced)   Colon polyps Sister    Colon cancer Neg Hx    Pancreatic cancer Neg Hx    Esophageal cancer Neg Hx    Rectal cancer Neg Hx    Stomach cancer Neg Hx     Social History   Tobacco Use   Smoking status: Never   Smokeless tobacco: Never  Substance Use Topics   Alcohol use: Yes   Drug use: Never    Home Medications Prior to Admission medications   Medication Sig Start Date End Date Taking? Authorizing Provider  Diclofenac Sodium CR 100 MG 24 hr tablet Take 1 tablet (100 mg total) by mouth daily. 02/19/21  Yes Veola Cafaro, MD  Doxylamine Succinate, Sleep, (SLEEP AID PO) Take by  mouth.    [provider]  escitalopram (LEXAPRO) 5 MG tablet TAKE 1 TABLET(5 MG) BY MOUTH DAILY 12/25/20   Nafziger, Tommi Rumps, NP  fexofenadine (ALLEGRA) 180 MG tablet Take 1 tablet (180 mg total) by mouth daily. 07/15/11   Swords, Darrick Penna, MD  fluticasone (FLONASE) 50 MCG/ACT nasal spray Place into the nose.    [provider]  triamcinolone (KENALOG) 0.1 % APPLY TO AFFECTED AREA TWICE A DAY 07/17/20   Nafziger, Tommi Rumps, NP  zolpidem (AMBIEN) 10 MG tablet TAKE 1/2 TAB AT NIGHT AS NEEDED 01/29/21   Dorothyann Peng, NP    Allergies    Patient has no known allergies.  Review of Systems   Review of Systems  Constitutional:  Negative for fever.  HENT:  Negative for drooling.   Eyes:  Negative for redness.  Respiratory:  Negative for shortness of breath.   Cardiovascular:  Negative for chest pain.  Gastrointestinal:  Negative for abdominal pain.  Genitourinary:  Negative for difficulty urinating.  Musculoskeletal:  Positive for arthralgias.  Skin:  Negative for rash.  Neurological:  Negative for headaches.  Psychiatric/Behavioral:  Negative for agitation.   All other systems reviewed and are negative.  Physical Exam Updated Vital Signs BP 132/88 (BP Location: Left Arm)   Pulse 90  Temp 98.3 F (36.8 C)   Resp 14   Ht 5\' 10"  (1.778 m)   Wt 79.4 kg   SpO2 98%   BMI 25.11 kg/m   Physical Exam Vitals and nursing note reviewed.  Constitutional:      General: He is not in acute distress.    Appearance: Normal appearance.  HENT:     Head: Normocephalic and atraumatic.     Nose: Nose normal.  Eyes:     Conjunctiva/sclera: Conjunctivae normal.     Pupils: Pupils are equal, round, and reactive to light.  Cardiovascular:     Rate and Rhythm: Normal rate and regular rhythm.     Pulses: Normal pulses.     Heart sounds: Normal heart sounds.  Pulmonary:     Effort: Pulmonary effort is normal.     Breath sounds: Normal breath sounds.  Abdominal:     General: Abdomen is  flat. Bowel sounds are normal.     Palpations: Abdomen is soft.     Tenderness: There is no abdominal tenderness. There is no guarding.  Musculoskeletal:        General: Normal range of motion.     Right shoulder: No swelling, deformity, effusion, laceration, tenderness, bony tenderness or crepitus. Normal range of motion. Normal strength. Normal pulse.     Right upper arm: Normal.     Right elbow: Normal.     Right forearm: Normal.     Right wrist: No bony tenderness or snuff box tenderness.     Cervical back: Normal range of motion and neck supple.  Skin:    General: Skin is warm and dry.     Capillary Refill: Capillary refill takes less than 2 seconds.  Neurological:     General: No focal deficit present.     Mental Status: He is alert and oriented to person, place, and time.     Deep Tendon Reflexes: Reflexes normal.  Psychiatric:        Mood and Affect: Mood normal.        Behavior: Behavior normal.    ED Results / Procedures / Treatments   Labs (all labs ordered are listed, but only abnormal results are displayed) Labs Reviewed - No data to display  EKG None  Radiology DG Shoulder Right  Result Date: 02/18/2021 CLINICAL DATA:  Right arm injury during tennis gain, dove and rolled, injuring shoulder EXAM: RIGHT SHOULDER - 2+ VIEW COMPARISON:  None. FINDINGS: Appearance of the distal clavicle suggestive prior resection, correlate with surgical history. Slight elevation relative to the acromion, particularly on the scapular Y-view, could reflect acute or chronic acromioclavicular injury as well. Glenohumeral alignment is maintained. Background of mild arthrosis of the glenohumeral joint. No other acute or conspicuous osseous abnormality. IMPRESSION: Suspect remote postsurgical changes of the distal clavicle. Elevation of the clavicle is somewhat nonspecific in a postsurgical state though could reflect an acute acromioclavicular injury. Correlate for point tenderness. No other  acute traumatic osseous abnormality. Mild glenohumeral arthrosis. Electronically Signed   By: Lovena Le M.D.   On: 02/18/2021 23:11    Procedures Procedures   Medications Ordered in ED Medications  meloxicam (MOBIC) tablet 15 mg (has no administration in time range)    ED Course  I have reviewed the triage vital signs and the nursing notes.  Pertinent labs & imaging results that were available during my care of the patient were reviewed by me and considered in my medical decision making (see chart for details).  MClinically patient has a mild AC separation.  Ice, NSAIDs and follow up with orthopedics.    Cody Knapp was evaluated in Emergency Department on 02/19/2021 for the symptoms described in the history of present illness. He was evaluated in the context of the global COVID-19 pandemic, which necessitated consideration that the patient might be at risk for infection with the SARS-CoV-2 virus that causes COVID-19. Institutional protocols and algorithms that pertain to the evaluation of patients at risk for COVID-19 are in a state of rapid change based on information released by regulatory bodies including the CDC and federal and state organizations. These policies and algorithms were followed during the patient's care in the ED.  Final Clinical Impression(s) / ED Diagnoses Final diagnoses:  None   Return for intractable cough, coughing up blood, fevers > 100.4 unrelieved by medication, shortness of breath, intractable vomiting, chest pain, shortness of breath, weakness, numbness, changes in speech, facial asymmetry, abdominal pain, passing out, Inability to tolerate liquids or food, cough, altered mental status or any concerns. No signs of systemic illness or infection. The patient is nontoxic-appearing on exam and vital signs are within normal limits. I have reviewed the triage vital signs and the nursing notes. Pertinent labs & imaging results that were available during my care  of the patient were reviewed by me and considered in my medical decision making (see chart for details). After history, exam, and medical workup I feel the patient has been appropriately medically screened and is safe for discharge home. Pertinent diagnoses were discussed with the patient. Patient was given return precautions.  Rx / DC Orders ED Discharge Orders          Ordered    Diclofenac Sodium CR 100 MG 24 hr tablet  Daily        02/19/21 0030             Remmy Riffe, MD 02/19/21 8850

## 2021-02-19 NOTE — ED Notes (Signed)
This RN presented the AVS utilizing Teachback Method. Patient verbalizes understanding of Discharge Instructions. Opportunity for Questioning and Answers were provided. Patient Discharged from ED ambulatory to Home with Significant Other.

## 2021-04-26 DIAGNOSIS — M25511 Pain in right shoulder: Secondary | ICD-10-CM | POA: Diagnosis not present

## 2021-06-15 ENCOUNTER — Other Ambulatory Visit: Payer: Self-pay | Admitting: Adult Health

## 2021-06-16 ENCOUNTER — Other Ambulatory Visit: Payer: Self-pay | Admitting: Adult Health

## 2021-06-16 DIAGNOSIS — F5101 Primary insomnia: Secondary | ICD-10-CM

## 2021-06-16 NOTE — Telephone Encounter (Signed)
Rx refilled for 30 days. Pt needs an CPE in Dec.

## 2021-06-17 NOTE — Telephone Encounter (Signed)
Pt needs CPE

## 2021-06-18 ENCOUNTER — Telehealth: Payer: Self-pay | Admitting: Adult Health

## 2021-06-18 DIAGNOSIS — F5101 Primary insomnia: Secondary | ICD-10-CM

## 2021-06-18 MED ORDER — ZOLPIDEM TARTRATE 10 MG PO TABS: ORAL_TABLET | ORAL | 1 refills | Status: DC

## 2021-06-18 NOTE — Telephone Encounter (Signed)
Patient called to get refill for zolpidem (AMBIEN) 10 MG tablet . Patient has scheduled CPE for 08/05/21 and wants temporary refill to last him.    Please send to  [id number] [address], [place] Phone:  [phone]  Fax:  [phone]       Please advise

## 2021-07-10 ENCOUNTER — Other Ambulatory Visit: Payer: Self-pay | Admitting: Adult Health

## 2021-07-14 ENCOUNTER — Other Ambulatory Visit: Payer: Self-pay | Admitting: Adult Health

## 2021-08-05 ENCOUNTER — Ambulatory Visit (INDEPENDENT_AMBULATORY_CARE_PROVIDER_SITE_OTHER): Payer: BC Managed Care – PPO | Admitting: Adult Health

## 2021-08-05 ENCOUNTER — Encounter: Payer: Self-pay | Admitting: Adult Health

## 2021-08-05 VITALS — BP 102/72 | HR 72 | Temp 98.0°F | Ht 69.5 in | Wt 177.0 lb

## 2021-08-05 DIAGNOSIS — Z0000 Encounter for general adult medical examination without abnormal findings: Secondary | ICD-10-CM | POA: Diagnosis not present

## 2021-08-05 DIAGNOSIS — J301 Allergic rhinitis due to pollen: Secondary | ICD-10-CM | POA: Diagnosis not present

## 2021-08-05 DIAGNOSIS — L299 Pruritus, unspecified: Secondary | ICD-10-CM

## 2021-08-05 DIAGNOSIS — F5101 Primary insomnia: Secondary | ICD-10-CM | POA: Diagnosis not present

## 2021-08-05 DIAGNOSIS — Z125 Encounter for screening for malignant neoplasm of prostate: Secondary | ICD-10-CM

## 2021-08-05 DIAGNOSIS — L719 Rosacea, unspecified: Secondary | ICD-10-CM

## 2021-08-05 LAB — COMPREHENSIVE METABOLIC PANEL
ALT: 17 U/L (ref 0–53)
AST: 20 U/L (ref 0–37)
Albumin: 4.3 g/dL (ref 3.5–5.2)
Alkaline Phosphatase: 67 U/L (ref 39–117)
BUN: 15 mg/dL (ref 6–23)
CO2: 31 mEq/L (ref 19–32)
Calcium: 9.3 mg/dL (ref 8.4–10.5)
Chloride: 104 mEq/L (ref 96–112)
Creatinine, Ser: 1.2 mg/dL (ref 0.40–1.50)
GFR: 66.44 mL/min (ref >60.00)
Glucose, Bld: 87 mg/dL (ref 70–99)
Potassium: 4.1 mEq/L (ref 3.5–5.1)
Sodium: 141 mEq/L (ref 135–145)
Total Bilirubin: 0.5 mg/dL (ref 0.2–1.2)
Total Protein: 6.4 g/dL (ref 6.0–8.3)

## 2021-08-05 LAB — CBC WITH DIFFERENTIAL/PLATELET
Basophils Absolute: 0 10*3/uL (ref 0.0–0.1)
Basophils Relative: 1 % (ref 0.0–3.0)
Eosinophils Absolute: 0.3 10*3/uL (ref 0.0–0.7)
Eosinophils Relative: 7.9 % — ABNORMAL HIGH (ref 0.0–5.0)
HCT: 43.4 % (ref 39.0–52.0)
Hemoglobin: 15.1 g/dL (ref 13.0–17.0)
Lymphocytes Relative: 28.8 % (ref 12.0–46.0)
Lymphs Abs: 1 10*3/uL (ref 0.7–4.0)
MCHC: 34.7 g/dL (ref 30.0–36.0)
MCV: 93.4 fl (ref 78.0–100.0)
Monocytes Absolute: 0.5 10*3/uL (ref 0.1–1.0)
Monocytes Relative: 14.3 % — ABNORMAL HIGH (ref 3.0–12.0)
Neutro Abs: 1.6 10*3/uL (ref 1.4–7.7)
Neutrophils Relative %: 48 % (ref 43.0–77.0)
Platelets: 154 10*3/uL (ref 150.0–400.0)
RBC: 4.65 Mil/uL (ref 4.22–5.81)
RDW: 13.5 % (ref 11.5–15.5)
WBC: 3.3 10*3/uL — ABNORMAL LOW (ref 4.0–10.5)

## 2021-08-05 LAB — TSH: TSH: 2.28 u[IU]/mL (ref 0.35–5.50)

## 2021-08-05 LAB — PSA: PSA: 1.02 ng/mL (ref 0.10–4.00)

## 2021-08-05 LAB — LIPID PANEL
Cholesterol: 141 mg/dL (ref 0–200)
HDL: 49.4 mg/dL (ref >39.00)
LDL Cholesterol: 81 mg/dL (ref 0–99)
NonHDL: 91.7
Total CHOL/HDL Ratio: 3
Triglycerides: 53 mg/dL (ref 0.0–149.0)
VLDL: 10.6 mg/dL (ref 0.0–40.0)

## 2021-08-05 MED ORDER — ESCITALOPRAM OXALATE 5 MG PO TABS: 5.0000 mg | ORAL_TABLET | Freq: Every day | ORAL | 3 refills | Status: DC

## 2021-08-05 NOTE — Patient Instructions (Signed)
It was great seeing you today   We will follow up with you regarding your lab work   I will see you back in one year or sooner if needed  Please let me know if you need anything

## 2021-08-05 NOTE — Progress Notes (Signed)
Subjective:    Patient ID: Cody Knapp, male    DOB: Jul 30, 1961, 60 y.o.   MRN: [id number]  HPI Patient presents for yearly preventative medicine examination. He is a pleasant 60 year old male who  has a past medical history of Allergy, Personal history of colonic adenoma (08/10/2013), and Rosacea.  Seasonal Allergies - Takes Allegra 180 mg PRN   Insomnia - takes Ambien 5 mg PRN   Rosacea - is followed buy Dermatology   Chronic Pruritis - Takes Lexapro 5 mg daily. Does report that this helps with his symptoms.    All immunizations and health maintenance protocols were reviewed with the patient and needed orders were placed.  Appropriate screening laboratory values were ordered for the patient including screening of hyperlipidemia, renal function and hepatic function. If indicated by BPH, a PSA was ordered.  Medication reconciliation,  past medical history, social history, problem list and allergies were reviewed in detail with the patient  Goals were established with regard to weight loss, exercise, and  diet in compliance with medications.  Continues to eat a heart healthy diet and does some form of aerobic exercise at least 3 times a week.  Wt Readings from Last 3 Encounters:  08/05/21 177 lb (80.3 kg)  02/18/21 175 lb (79.4 kg)  08/01/20 176 lb 9.6 oz (80.1 kg)   He is up-to-date on routine colon cancer screening.  Review of Systems  Constitutional: Negative.   HENT: Negative.    Eyes: Negative.   Respiratory: Negative.    Cardiovascular: Negative.   Gastrointestinal: Negative.   Endocrine: Negative.   Genitourinary: Negative.   Musculoskeletal: Negative.   Skin: Negative.   Allergic/Immunologic: Negative.   Neurological: Negative.   Hematological: Negative.   Psychiatric/Behavioral: Negative.    All other systems reviewed and are negative.  Past Medical History:  Diagnosis Date   Allergy    Personal history of colonic adenoma 08/10/2013   Rosacea      Social History   Socioeconomic History   Marital status: Married    Spouse name: Not on file   Number of children: Not on file   Years of education: Not on file   Highest education level: Not on file  Occupational History   Not on file  Tobacco Use   Smoking status: Never   Smokeless tobacco: Never  Substance and Sexual Activity   Alcohol use: Yes   Drug use: Never   Sexual activity: Not on file  Other Topics Concern   Not on file  Social History Narrative   Works in Charity fundraiser    Married    Three daughters ( 23, 19,16)    Social Determinants of Health   Financial Resource Strain: Not on file  Food Insecurity: Not on file  Transportation Needs: Not on file  Physical Activity: Not on file  Stress: Not on file  Social Connections: Not on file  Intimate Partner Violence: Not on file    Past Surgical History:  Procedure Laterality Date   COLONOSCOPY     KNEE ARTHROSCOPY Right     Family History  Problem Relation Age of Onset   Heart disease Father        aortic vavle disease (replaced)   Colon polyps Sister    Colon cancer Neg Hx    Pancreatic cancer Neg Hx    Esophageal cancer Neg Hx    Rectal cancer Neg Hx    Stomach cancer Neg Hx     No Known Allergies  Current Outpatient Medications on File Prior to Visit  Medication Sig Dispense Refill   clobetasol (TEMOVATE) 0.05 % external solution Apply topically daily as needed.     Doxylamine Succinate, Sleep, (SLEEP AID PO) Take by mouth.     escitalopram (LEXAPRO) 5 MG tablet TAKE 1 TABLET BY MOUTH EVERY DAY 30 tablet 0   triamcinolone (KENALOG) 0.1 % APPLY TO AFFECTED AREA TWICE A DAY 45 g 1   zolpidem (AMBIEN) 10 MG tablet TAKE 1/2 TAB AT NIGHT AS NEEDED 15 tablet 1   fexofenadine (ALLEGRA) 180 MG tablet Take 1 tablet (180 mg total) by mouth daily. (Patient not taking: Reported on 08/05/2021) 30 tablet 11   fluticasone (FLONASE) 50 MCG/ACT nasal spray Place into the nose. (Patient not taking: Reported on  08/05/2021)     No current facility-administered medications on file prior to visit.    BP 102/72   Pulse 72   Temp 98 F (36.7 C) (Oral)   Ht 5' 9.5" (1.765 m)   Wt 177 lb (80.3 kg)   SpO2 98%   BMI 25.76 kg/m        Objective:   Physical Exam Vitals and nursing note reviewed.  Constitutional:      General: He is not in acute distress.    Appearance: Normal appearance. He is well-developed and normal weight.  HENT:     Head: Normocephalic and atraumatic.     Right Ear: Tympanic membrane, ear canal and external ear normal. There is no impacted cerumen.     Left Ear: Tympanic membrane, ear canal and external ear normal. There is no impacted cerumen.     Nose: Nose normal. No congestion or rhinorrhea.     Mouth/Throat:     Mouth: Mucous membranes are moist.     Pharynx: Oropharynx is clear. No oropharyngeal exudate or posterior oropharyngeal erythema.  Eyes:     General:        Right eye: No discharge.        Left eye: No discharge.     Extraocular Movements: Extraocular movements intact.     Conjunctiva/sclera: Conjunctivae normal.     Pupils: Pupils are equal, round, and reactive to light.  Neck:     Vascular: No carotid bruit.     Trachea: No tracheal deviation.  Cardiovascular:     Rate and Rhythm: Normal rate and regular rhythm.     Pulses: Normal pulses.     Heart sounds: Normal heart sounds. No murmur heard.   No friction rub. No gallop.  Pulmonary:     Effort: Pulmonary effort is normal. No respiratory distress.     Breath sounds: Normal breath sounds. No stridor. No wheezing, rhonchi or rales.  Chest:     Chest wall: No tenderness.  Abdominal:     General: Bowel sounds are normal. There is no distension.     Palpations: Abdomen is soft. There is no mass.     Tenderness: There is no abdominal tenderness. There is no right CVA tenderness, left CVA tenderness, guarding or rebound.     Hernia: No hernia is present.  Musculoskeletal:        General: No  swelling, tenderness, deformity or signs of injury. Normal range of motion.     Right lower leg: No edema.     Left lower leg: No edema.  Lymphadenopathy:     Cervical: No cervical adenopathy.  Skin:    General: Skin is warm and dry.     Capillary  Refill: Capillary refill takes less than 2 seconds.     Coloration: Skin is not jaundiced or pale.     Findings: No bruising, erythema, lesion or rash.  Neurological:     General: No focal deficit present.     Mental Status: He is alert and oriented to person, place, and time.     Cranial Nerves: No cranial nerve deficit.     Sensory: No sensory deficit.     Motor: No weakness.     Coordination: Coordination normal.     Gait: Gait normal.     Deep Tendon Reflexes: Reflexes normal.  Psychiatric:        Mood and Affect: Mood normal.        Behavior: Behavior normal.        Thought Content: Thought content normal.        Judgment: Judgment normal.      Assessment & Plan:  1. Routine general medical examination at a health care facility - Benign exam  - Follow up in one year or sooner if needed - CBC with Differential/Platelet; Future - Comprehensive metabolic panel; Future - Lipid panel; Future - TSH; Future  2. Primary insomnia - Continue with Ambien PRN  3. Prostate cancer screening  - PSA; Future  4. Seasonal allergic rhinitis due to pollen - Continue with OTC medications   5. Rosacea - Follow up with Dermatology as directed  6. Chronic pruritus  - escitalopram (LEXAPRO) 5 MG tablet; Take 1 tablet (5 mg total) by mouth daily.  Dispense: 90 tablet; Refill: 3   Dorothyann Peng, NP

## 2021-09-17 ENCOUNTER — Other Ambulatory Visit: Payer: Self-pay | Admitting: Adult Health

## 2021-09-17 DIAGNOSIS — F5101 Primary insomnia: Secondary | ICD-10-CM

## 2021-09-18 NOTE — Telephone Encounter (Signed)
Last filled 06/18/2021 Last OV 08/05/2021  Ok to fill?

## 2021-10-01 DIAGNOSIS — L719 Rosacea, unspecified: Secondary | ICD-10-CM | POA: Diagnosis not present

## 2021-10-01 DIAGNOSIS — L820 Inflamed seborrheic keratosis: Secondary | ICD-10-CM | POA: Diagnosis not present

## 2021-10-01 DIAGNOSIS — L821 Other seborrheic keratosis: Secondary | ICD-10-CM | POA: Diagnosis not present

## 2021-10-01 DIAGNOSIS — D225 Melanocytic nevi of trunk: Secondary | ICD-10-CM | POA: Diagnosis not present

## 2021-10-01 DIAGNOSIS — L814 Other melanin hyperpigmentation: Secondary | ICD-10-CM | POA: Diagnosis not present

## 2021-10-01 DIAGNOSIS — L570 Actinic keratosis: Secondary | ICD-10-CM | POA: Diagnosis not present

## 2021-12-10 DIAGNOSIS — L719 Rosacea, unspecified: Secondary | ICD-10-CM | POA: Diagnosis not present

## 2021-12-10 DIAGNOSIS — L219 Seborrheic dermatitis, unspecified: Secondary | ICD-10-CM | POA: Diagnosis not present

## 2021-12-16 ENCOUNTER — Ambulatory Visit (INDEPENDENT_AMBULATORY_CARE_PROVIDER_SITE_OTHER): Payer: BC Managed Care – PPO | Admitting: Adult Health

## 2021-12-16 ENCOUNTER — Encounter: Payer: Self-pay | Admitting: Adult Health

## 2021-12-16 DIAGNOSIS — F5101 Primary insomnia: Secondary | ICD-10-CM

## 2021-12-16 DIAGNOSIS — N529 Male erectile dysfunction, unspecified: Secondary | ICD-10-CM | POA: Diagnosis not present

## 2021-12-16 MED ORDER — TADALAFIL 20 MG PO TABS: 10.0000 mg | ORAL_TABLET | ORAL | 11 refills | Status: DC | PRN

## 2021-12-16 NOTE — Progress Notes (Signed)
Virtual Visit via Telephone Note ? ?I connected with Robina Ade on 12/16/21 at 11:45 AM EDT by telephone and verified that I am speaking with the correct person using two identifiers. ?  ?I discussed the limitations, risks, security and privacy concerns of performing an evaluation and management service by telephone and the availability of in person appointments. I also discussed with the patient that there may be a patient responsible charge related to this service. The patient expressed understanding and agreed to proceed. ? ?Location patient: home ?Location provider: work or home office ?Participants present for the call: patient, provider ?Patient did not have a visit in the prior 7 days to address this/these issue(s). ? ? ?History of Present Illness: ?61 year old male who is being evaluated today for 2 separate issues. ? ?First issue is that of insomnia.  He does have a longstanding history of insomnia and is prescribed Ambien 5 mg.  He takes his Ambien roughly every other day.  On the days that he does not take his Ambien he feels as though his insomnia has been getting worse.  He has trouble falling asleep and does not get tired till 12 to 1 AM.  In the past he was usually able to fall asleep after reading a few pages in a book but now he can read a few hours without becoming tired.  He is Free and does not work out too late.  He does try and practice good sleep hygiene.  Not want to have to use Ambien every night to sleep ? ?Other issue is more of an acute issue of erectile dysfunction.  Reports lately he is having trouble maintaining an erection. ?  ?Observations/Objective: ?Patient sounds cheerful and well on the phone. ?I do not appreciate any SOB. ?Speech and thought processing are grossly intact. ?Patient reported vitals: ? ?Assessment and Plan: ?1. Erectile dysfunction, unspecified erectile dysfunction type ? ?- tadalafil (CIALIS) 20 MG tablet; Take 0.5-1 tablets (10-20 mg total) by mouth every  other day as needed for erectile dysfunction.  Dispense: 10 tablet; Refill: 11 ? ?2. Primary insomnia ? ?- Ambulatory referral to Pulmonology ? ? ? ?Follow Up Instructions: ? ? ? ? ?27517 5-10 ?99442 11-20 ?9443 21-30 ?I did not refer this patient for an OV in the next 24 hours for this/these issue(s). ? ?I discussed the assessment and treatment plan with the patient. The patient was provided an opportunity to ask questions and all were answered. The patient agreed with the plan and demonstrated an understanding of the instructions. ?  ?The patient was advised to call back or seek an in-person evaluation if the symptoms worsen or if the condition fails to improve as anticipated. ? ?I provided 22 minutes of non-face-to-face time during this encounter. ? ? ?Dorothyann Peng, NP  ? ?

## 2022-01-16 ENCOUNTER — Institutional Professional Consult (permissible substitution): Payer: BC Managed Care – PPO | Admitting: Pulmonary Disease

## 2022-01-27 ENCOUNTER — Other Ambulatory Visit: Payer: Self-pay | Admitting: Adult Health

## 2022-01-27 DIAGNOSIS — F5101 Primary insomnia: Secondary | ICD-10-CM

## 2022-02-02 ENCOUNTER — Telehealth: Payer: Self-pay | Admitting: Pulmonary Disease

## 2022-02-02 ENCOUNTER — Encounter: Payer: Self-pay | Admitting: Pulmonary Disease

## 2022-02-02 ENCOUNTER — Ambulatory Visit (INDEPENDENT_AMBULATORY_CARE_PROVIDER_SITE_OTHER): Payer: BC Managed Care – PPO | Admitting: Pulmonary Disease

## 2022-02-02 VITALS — BP 112/70 | HR 71 | Ht 70.0 in | Wt 184.4 lb

## 2022-02-02 DIAGNOSIS — F5104 Psychophysiologic insomnia: Secondary | ICD-10-CM

## 2022-02-02 MED ORDER — ZOLPIDEM TARTRATE 10 MG PO TABS: 10.0000 mg | ORAL_TABLET | Freq: Every evening | ORAL | 5 refills | Status: DC | PRN

## 2022-02-02 NOTE — Patient Instructions (Addendum)
I will see you back in about 6 months  Continue using your sleep aid as tolerated  Call with significant concerns  Try to maintain a healthy sleep schedule

## 2022-02-02 NOTE — Progress Notes (Signed)
Cody Knapp    [id number]    [birth year]  Primary Care Physician:Nafziger, Tommi Rumps, NP  Referring Physician: Dorothyann Peng, NP [address],  [place] [address]  Chief complaint:   Patient being seen for chronic insomnia over 20-year history of insomnia    HPI:  Ambien does help him get to sleep Ambien does help him stay asleep  Usually gets about 6 hours of sleep usually wakes up about 6:30 in the morning with an alarm Usually in bed about 11-12, after hour to 1 hour to fall asleep 2-3 awakenings Weight has been stable  Denies any history of heart problems No shortness of breath with activity  Does not feel limited with activities  Has used over-the-counter medications to try and aid sleep and they were are not effective  Melatonin does not help  Occasionally does use doxylamine which helps sometimes   Outpatient Encounter Medications as of 02/02/2022  Medication Sig   Doxylamine Succinate, Sleep, (SLEEP AID PO) Take by mouth.   fexofenadine (ALLEGRA) 180 MG tablet Take 1 tablet (180 mg total) by mouth daily.   fluticasone (FLONASE) 50 MCG/ACT nasal spray Place into the nose.   tadalafil (CIALIS) 20 MG tablet Take 0.5-1 tablets (10-20 mg total) by mouth every other day as needed for erectile dysfunction.   triamcinolone (KENALOG) 0.1 % APPLY TO AFFECTED AREA TWICE A DAY   zolpidem (AMBIEN) 10 MG tablet TAKE 1/2 TABLET AT NIGHT AS NEEDED   [DISCONTINUED] clobetasol (TEMOVATE) 0.05 % external solution Apply topically daily as needed.   betamethasone, augmented, (DIPROLENE) 0.05 % lotion Apply topically daily as needed.   [DISCONTINUED] escitalopram (LEXAPRO) 5 MG tablet Take 1 tablet (5 mg total) by mouth daily.   No facility-administered encounter medications on file as of 02/02/2022.    Allergies as of 02/02/2022   (No Known Allergies)    Past Medical History:  Diagnosis Date   Allergy    Personal history of colonic adenoma  08/10/2013   Rosacea     Past Surgical History:  Procedure Laterality Date   COLONOSCOPY     KNEE ARTHROSCOPY Right     Family History  Problem Relation Age of Onset   Heart disease Father        aortic vavle disease (replaced)   Colon polyps Sister    Colon cancer Neg Hx    Pancreatic cancer Neg Hx    Esophageal cancer Neg Hx    Rectal cancer Neg Hx    Stomach cancer Neg Hx     Social History   Socioeconomic History   Marital status: Married    Spouse name: Not on file   Number of children: Not on file   Years of education: Not on file   Highest education level: Bachelor's degree (e.g., BA, AB, BS)  Occupational History   Not on file  Tobacco Use   Smoking status: Never   Smokeless tobacco: Never  Substance and Sexual Activity   Alcohol use: Yes   Drug use: Never   Sexual activity: Not on file  Other Topics Concern   Not on file  Social History Narrative   Works in Charity fundraiser    Married    Three daughters ( 23, 19,16)    Social Determinants of Health   Financial Resource Strain: [place]  (12/15/2021)   Overall Financial Resource Strain (CARDIA)    Difficulty of Paying Living Expenses: Not hard at all  Food Insecurity:  No Food Insecurity (12/15/2021)   Hunger Vital Sign    Worried About Running Out of Food in the Last Year: Never true    Ran Out of Food in the Last Year: Never true  Transportation Needs: No Transportation Needs (12/15/2021)   PRAPARE - Hydrologist (Medical): No    Lack of Transportation (Non-Medical): No  Physical Activity: Sufficiently Active (12/15/2021)   Exercise Vital Sign    Days of Exercise per Week: 4 days    Minutes of Exercise per Session: 60 min  Stress: Stress Concern Present (12/15/2021)   [place]    Feeling of Stress : Very much  Social Connections: Socially Integrated (12/15/2021)   Social Connection and Isolation Panel [NHANES]     Frequency of Communication with Friends and Family: More than three times a week    Frequency of Social Gatherings with Friends and Family: Once a week    Attends Religious Services: 1 to 4 times per year    Active Member of [employer] or Organizations: Yes    Attends Archivist Meetings: 1 to 4 times per year    Marital Status: Married  Human resources officer Violence: Not on file    Review of Systems  Constitutional:  Negative for fatigue.  Respiratory:  Negative for shortness of breath.   Psychiatric/Behavioral:  Positive for sleep disturbance.     Vitals:   02/02/22 1608  BP: 112/70  Pulse: 71  SpO2: 98%     Physical Exam HENT:     Head: Normocephalic.     Nose: Nose normal.  Eyes:     Pupils: Pupils are equal, round, and reactive to light.  Cardiovascular:     Rate and Rhythm: Normal rate and regular rhythm.     Heart sounds: No murmur heard.    No friction rub.  Pulmonary:     Effort: No respiratory distress.     Breath sounds: No stridor. No wheezing, rhonchi or rales.  Musculoskeletal:     Cervical back: No rigidity or tenderness.  Neurological:     Mental Status: He is alert.  Psychiatric:        Mood and Affect: Mood normal.       02/02/2022    4:00 PM  Results of the Epworth flowsheet  Sitting and reading 1  Watching TV 1  Sitting, inactive in a public place (e.g. a theatre or a meeting) 0  As a passenger in a car for an hour without a break 1  Lying down to rest in the afternoon when circumstances permit 0  Sitting and talking to someone 0  Sitting quietly after a lunch without alcohol 1  In a car, while stopped for a few minutes in traffic 1  Total score 5     Data Reviewed: No previous sleep study  Assessment:  Chronic insomnia  No concern for sleep disordered breathing  Plan/Recommendations: I will see you back in about 6 months  Continue using Ambien as needed  Encourage to optimize sleep hygiene as able  Sherrilyn Rist  MD [facility] 02/02/2022, 4:17 PM  CC: Dorothyann Peng, NP

## 2022-02-24 IMAGING — DX DG SHOULDER 2+V*R*
3 series · 3 of 3 positions shown · non-contrast
Comparison: None.

CLINICAL DATA: Right arm injury during tennis gain, Yksi and
rolled, injuring shoulder

EXAM:
RIGHT SHOULDER - 2+ VIEW

[shoulder grashey]
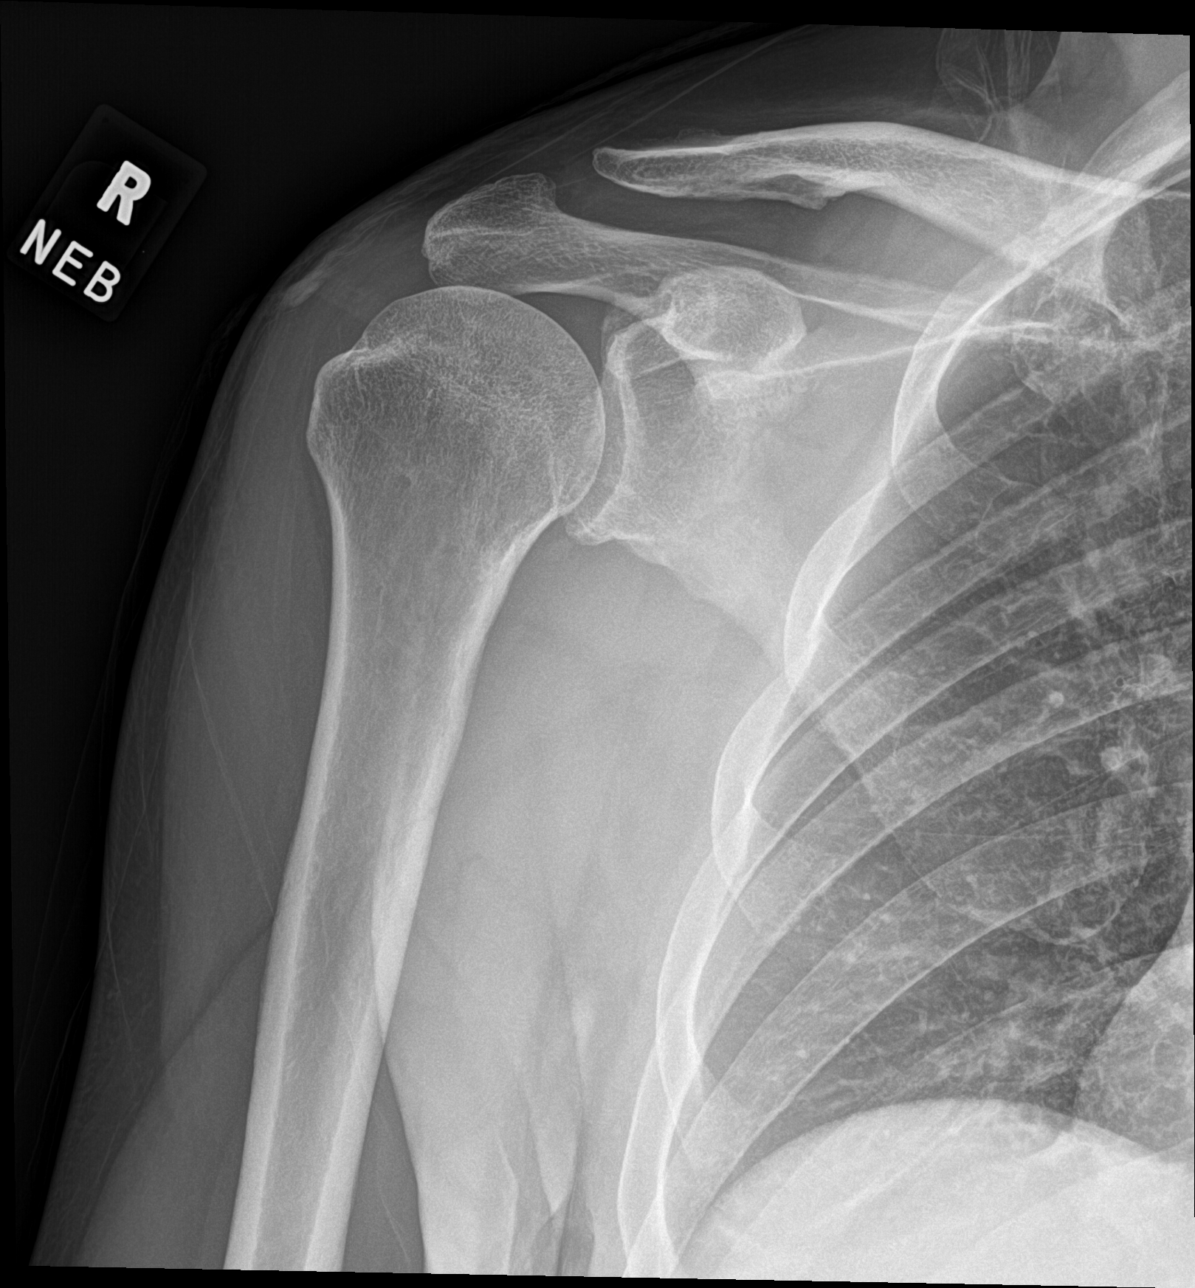

[shoulder y view]
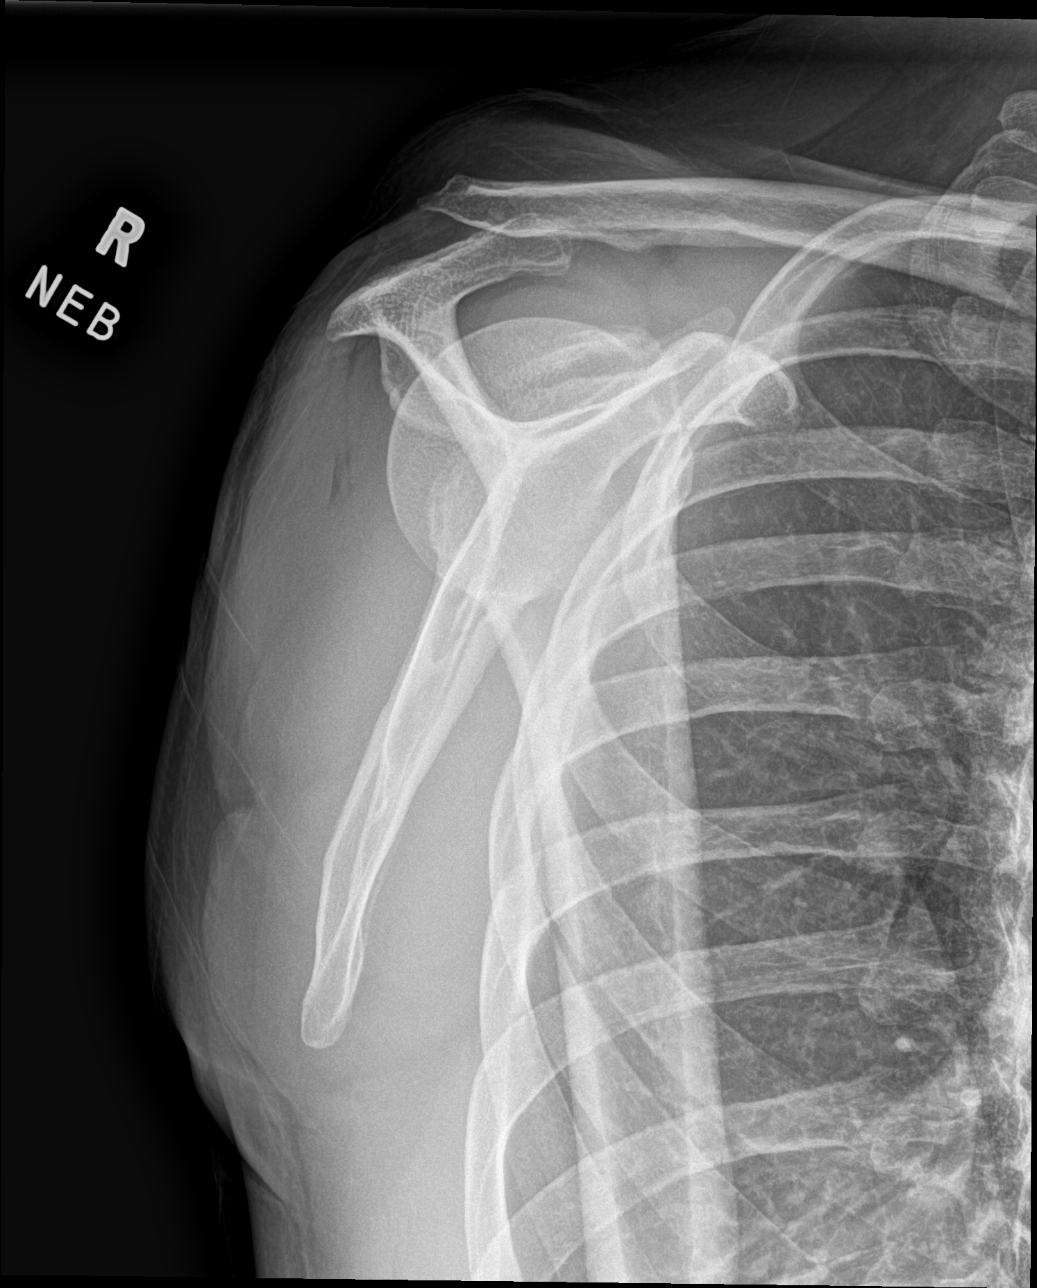

[shoulder axillary]
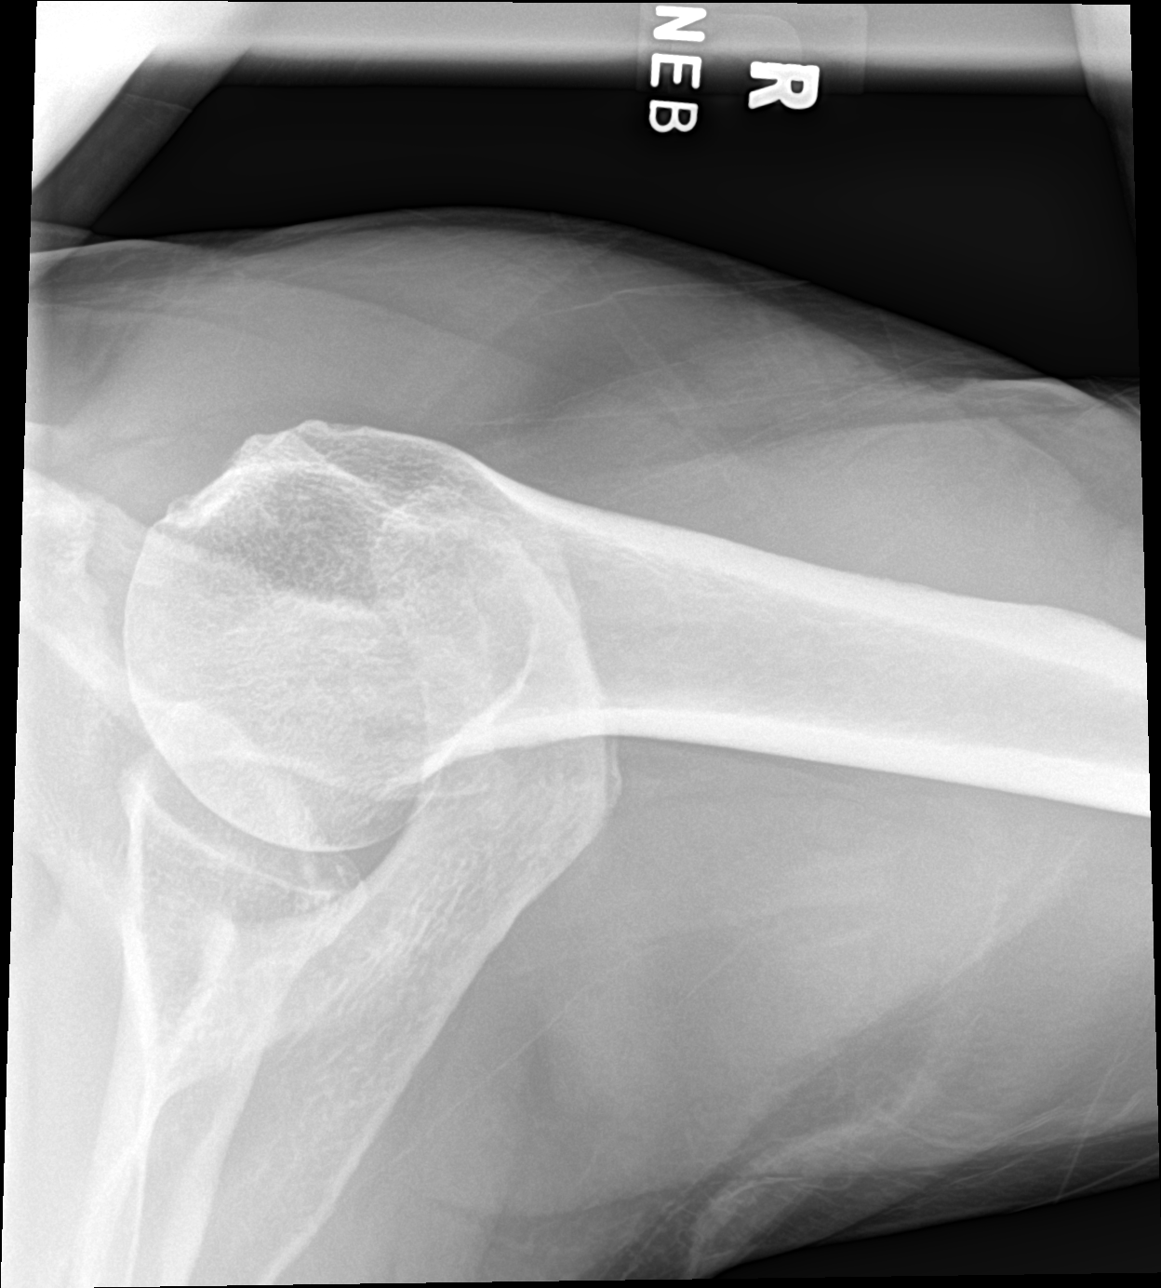

[3 of 3 positions shown; findings below may reference images not displayed]

FINDINGS: Appearance of the distal clavicle suggestive prior resection,
correlate with surgical history. Slight elevation relative to the
acromion, particularly on the scapular Y-view, could reflect acute
or chronic acromioclavicular injury as well. Glenohumeral alignment
is maintained. Background of mild arthrosis of the glenohumeral
joint. No other acute or conspicuous osseous abnormality.
IMPRESSION: Suspect remote postsurgical changes of the distal clavicle.
Elevation of the clavicle is somewhat nonspecific in a postsurgical
state though could reflect an acute acromioclavicular injury.
Correlate for point tenderness.

No other acute traumatic osseous abnormality.

Mild glenohumeral arthrosis.

## 2022-09-04 ENCOUNTER — Other Ambulatory Visit: Payer: Self-pay | Admitting: Adult Health

## 2022-09-04 DIAGNOSIS — L299 Pruritus, unspecified: Secondary | ICD-10-CM

## 2022-09-04 NOTE — Telephone Encounter (Signed)
  The original prescription was discontinued on 02/02/2022 by Priscille Kluver, CMA. Renewing this prescription may not be appropriate.

## 2022-09-11 ENCOUNTER — Ambulatory Visit (INDEPENDENT_AMBULATORY_CARE_PROVIDER_SITE_OTHER): Payer: BC Managed Care – PPO | Admitting: Adult Health

## 2022-09-11 ENCOUNTER — Encounter: Payer: Self-pay | Admitting: Adult Health

## 2022-09-11 VITALS — BP 120/78 | HR 70 | Temp 97.9°F | Ht 69.5 in | Wt 181.0 lb

## 2022-09-11 DIAGNOSIS — Z114 Encounter for screening for human immunodeficiency virus [HIV]: Secondary | ICD-10-CM | POA: Diagnosis not present

## 2022-09-11 DIAGNOSIS — F5101 Primary insomnia: Secondary | ICD-10-CM

## 2022-09-11 DIAGNOSIS — N529 Male erectile dysfunction, unspecified: Secondary | ICD-10-CM | POA: Diagnosis not present

## 2022-09-11 DIAGNOSIS — Z0000 Encounter for general adult medical examination without abnormal findings: Secondary | ICD-10-CM

## 2022-09-11 DIAGNOSIS — Z125 Encounter for screening for malignant neoplasm of prostate: Secondary | ICD-10-CM | POA: Diagnosis not present

## 2022-09-11 DIAGNOSIS — L299 Pruritus, unspecified: Secondary | ICD-10-CM

## 2022-09-11 DIAGNOSIS — R5383 Other fatigue: Secondary | ICD-10-CM | POA: Diagnosis not present

## 2022-09-11 DIAGNOSIS — J301 Allergic rhinitis due to pollen: Secondary | ICD-10-CM

## 2022-09-11 DIAGNOSIS — L719 Rosacea, unspecified: Secondary | ICD-10-CM

## 2022-09-11 LAB — CBC WITH DIFFERENTIAL/PLATELET
Basophils Absolute: 0 10*3/uL (ref 0.0–0.1)
Basophils Relative: 0.7 % (ref 0.0–3.0)
Eosinophils Absolute: 0.3 10*3/uL (ref 0.0–0.7)
Eosinophils Relative: 8.5 % — ABNORMAL HIGH (ref 0.0–5.0)
HCT: 38.3 % — ABNORMAL LOW (ref 39.0–52.0)
Hemoglobin: 13 g/dL (ref 13.0–17.0)
Lymphocytes Relative: 28.3 % (ref 12.0–46.0)
Lymphs Abs: 1.1 10*3/uL (ref 0.7–4.0)
MCHC: 33.9 g/dL (ref 30.0–36.0)
MCV: 85.7 fl (ref 78.0–100.0)
Monocytes Absolute: 0.5 10*3/uL (ref 0.1–1.0)
Monocytes Relative: 12.2 % — ABNORMAL HIGH (ref 3.0–12.0)
Neutro Abs: 1.9 10*3/uL (ref 1.4–7.7)
Neutrophils Relative %: 50.3 % (ref 43.0–77.0)
Platelets: 142 10*3/uL — ABNORMAL LOW (ref 150.0–400.0)
RBC: 4.47 Mil/uL (ref 4.22–5.81)
RDW: 14 % (ref 11.5–15.5)
WBC: 3.7 10*3/uL — ABNORMAL LOW (ref 4.0–10.5)

## 2022-09-11 LAB — COMPREHENSIVE METABOLIC PANEL
ALT: 15 U/L (ref 0–53)
AST: 20 U/L (ref 0–37)
Albumin: 4.2 g/dL (ref 3.5–5.2)
Alkaline Phosphatase: 60 U/L (ref 39–117)
BUN: 15 mg/dL (ref 6–23)
CO2: 28 mEq/L (ref 19–32)
Calcium: 9.2 mg/dL (ref 8.4–10.5)
Chloride: 104 mEq/L (ref 96–112)
Creatinine, Ser: 1.22 mg/dL (ref 0.40–1.50)
GFR: 64.63 mL/min (ref >60.00)
Glucose, Bld: 84 mg/dL (ref 70–99)
Potassium: 4.4 mEq/L (ref 3.5–5.1)
Sodium: 142 mEq/L (ref 135–145)
Total Bilirubin: 0.6 mg/dL (ref 0.2–1.2)
Total Protein: 6.4 g/dL (ref 6.0–8.3)

## 2022-09-11 LAB — LIPID PANEL
Cholesterol: 159 mg/dL (ref 0–200)
HDL: 53.2 mg/dL (ref >39.00)
LDL Cholesterol: 94 mg/dL (ref 0–99)
NonHDL: 106.02
Total CHOL/HDL Ratio: 3
Triglycerides: 62 mg/dL (ref 0.0–149.0)
VLDL: 12.4 mg/dL (ref 0.0–40.0)

## 2022-09-11 LAB — PSA: PSA: 1.04 ng/mL (ref 0.10–4.00)

## 2022-09-11 LAB — TSH: TSH: 2.44 u[IU]/mL (ref 0.35–5.50)

## 2022-09-11 MED ORDER — TRIAMCINOLONE ACETONIDE 0.1 % EX CREA: TOPICAL_CREAM | CUTANEOUS | 0 refills | Status: AC

## 2022-09-11 MED ORDER — ESCITALOPRAM OXALATE 5 MG PO TABS: 5.0000 mg | ORAL_TABLET | Freq: Every day | ORAL | 3 refills | Status: DC

## 2022-09-11 NOTE — Progress Notes (Signed)
Subjective:    Patient ID: Cody Knapp, male    DOB: 25-Mar-1961, 62 y.o.   MRN: [id number]  HPI Patient presents for yearly preventative medicine examination. He is a pleasant 62 year old male who  has a past medical history of Allergy, Personal history of colonic adenoma (08/10/2013), and Rosacea.  Seasonal Allergies - Takes Allegra 180 mg PRN   Insomnia - takes Ambien 10 mg daily - managed by pulmonary   Rosacea - is followed buy Dermatology   Chronic Pruritis - Takes Lexapro 5 mg daily. Does report that this helps with his symptoms.   ED - does not feel as though Cialis worked well for him. He would like to have his Testosterone checked.  All immunizations and health maintenance protocols were reviewed with the patient and needed orders were placed.  Appropriate screening laboratory values were ordered for the patient including screening of hyperlipidemia, renal function and hepatic function. If indicated by BPH, a PSA was ordered.  Medication reconciliation,  past medical history, social history, problem list and allergies were reviewed in detail with the patient  Goals were established with regard to weight loss, exercise, and  diet in compliance with medications Wt Readings from Last 3 Encounters:  09/11/22 181 lb (82.1 kg)  02/02/22 184 lb 6.4 oz (83.6 kg)  08/05/21 177 lb (80.3 kg)   Review of Systems  Constitutional: Negative.   HENT: Negative.    Eyes: Negative.   Respiratory: Negative.    Cardiovascular: Negative.   Gastrointestinal: Negative.   Endocrine: Negative.   Genitourinary: Negative.   Musculoskeletal: Negative.   Skin: Negative.   Allergic/Immunologic: Negative.   Neurological: Negative.   Hematological: Negative.   Psychiatric/Behavioral: Negative.    All other systems reviewed and are negative.  Past Medical History:  Diagnosis Date   Allergy    Personal history of colonic adenoma 08/10/2013   Rosacea     Social History    Socioeconomic History   Marital status: Married    Spouse name: Not on file   Number of children: Not on file   Years of education: Not on file   Highest education level: Bachelor's degree (e.g., BA, AB, BS)  Occupational History   Not on file  Tobacco Use   Smoking status: Never   Smokeless tobacco: Never  Substance and Sexual Activity   Alcohol use: Yes   Drug use: Never   Sexual activity: Not on file  Other Topics Concern   Not on file  Social History Narrative   Works in Charity fundraiser    Married    Three daughters ( 23, 19,16)    Social Determinants of Health   Financial Resource Strain: Low Risk  (12/15/2021)   Overall Financial Resource Strain (CARDIA)    Difficulty of Paying Living Expenses: Not hard at all  Food Insecurity: No Food Insecurity (12/15/2021)   Hunger Vital Sign    Worried About Running Out of Food in the Last Year: Never true    [place] in the Last Year: Never true  Transportation Needs: No Transportation Needs (12/15/2021)   PRAPARE - Hydrologist (Medical): No    Lack of Transportation (Non-Medical): No  Physical Activity: Sufficiently Active (12/15/2021)   Exercise Vital Sign    Days of Exercise per Week: 4 days    Minutes of Exercise per Session: 60 min  Stress: Stress Concern Present (12/15/2021)   [place]  Questionnaire    Feeling of Stress : Very much  Social Connections: Socially Integrated (12/15/2021)   Social Connection and Isolation Panel [NHANES]    Frequency of Communication with Friends and Family: More than three times a week    Frequency of Social Gatherings with Friends and Family: Once a week    Attends Religious Services: 1 to 4 times per year    Active Member of [employer] or Organizations: Yes    Attends Archivist Meetings: 1 to 4 times per year    Marital Status: Married  Human resources officer Violence: Not on file    Past Surgical  History:  Procedure Laterality Date   COLONOSCOPY     KNEE ARTHROSCOPY Right     Family History  Problem Relation Age of Onset   Heart disease Father        aortic vavle disease (replaced)   Colon polyps Sister    Colon cancer Neg Hx    Pancreatic cancer Neg Hx    Esophageal cancer Neg Hx    Rectal cancer Neg Hx    Stomach cancer Neg Hx     No Known Allergies  Current Outpatient Medications on File Prior to Visit  Medication Sig Dispense Refill   betamethasone, augmented, (DIPROLENE) 0.05 % lotion Apply topically daily as needed.     Doxylamine Succinate, Sleep, (SLEEP AID PO) Take by mouth.     fexofenadine (ALLEGRA) 180 MG tablet Take 1 tablet (180 mg total) by mouth daily. 30 tablet 11   fluticasone (FLONASE) 50 MCG/ACT nasal spray Place into the nose.     tadalafil (CIALIS) 20 MG tablet Take 0.5-1 tablets (10-20 mg total) by mouth every other day as needed for erectile dysfunction. 10 tablet 11   triamcinolone (KENALOG) 0.1 % APPLY TO AFFECTED AREA TWICE A DAY 45 g 1   zolpidem (AMBIEN) 10 MG tablet TAKE 1/2 TABLET AT NIGHT AS NEEDED 15 tablet 1   zolpidem (AMBIEN) 10 MG tablet Take 1 tablet (10 mg total) by mouth at bedtime as needed for sleep. 30 tablet 5   No current facility-administered medications on file prior to visit.    There were no vitals taken for this visit.      Objective:   Physical Exam Vitals and nursing note reviewed.  Constitutional:      General: He is not in acute distress.    Appearance: Normal appearance. He is well-developed. He is not ill-appearing or diaphoretic.  HENT:     Head: Normocephalic and atraumatic.     Right Ear: Tympanic membrane, ear canal and external ear normal. There is no impacted cerumen.     Left Ear: Tympanic membrane, ear canal and external ear normal. There is no impacted cerumen.     Nose: Nose normal. No congestion or rhinorrhea.     Mouth/Throat:     Mouth: Mucous membranes are moist.     Pharynx: Oropharynx is  clear. No oropharyngeal exudate.  Eyes:     General:        Right eye: No discharge.        Left eye: No discharge.     Extraocular Movements: Extraocular movements intact.     Conjunctiva/sclera: Conjunctivae normal.     Pupils: Pupils are equal, round, and reactive to light.  Neck:     Thyroid: No thyromegaly.     Vascular: No carotid bruit.     Trachea: No tracheal deviation.  Cardiovascular:     Rate and Rhythm:  Normal rate and regular rhythm.     Pulses: Normal pulses.     Heart sounds: Normal heart sounds. No murmur heard.    No friction rub. No gallop.  Pulmonary:     Effort: Pulmonary effort is normal. No respiratory distress.     Breath sounds: Normal breath sounds. No wheezing or rales.  Chest:     Chest wall: No tenderness.  Abdominal:     General: Abdomen is flat. Bowel sounds are normal. There is no distension.     Palpations: Abdomen is soft. There is no mass.     Tenderness: There is no abdominal tenderness. There is no guarding or rebound.     Hernia: No hernia is present.  Musculoskeletal:        General: Normal range of motion.     Cervical back: Normal range of motion and neck supple.  Lymphadenopathy:     Cervical: No cervical adenopathy.  Skin:    General: Skin is warm and dry.     Capillary Refill: Capillary refill takes less than 2 seconds.     Coloration: Skin is not jaundiced or pale.     Findings: No bruising, erythema, lesion or rash.  Neurological:     General: No focal deficit present.     Mental Status: He is alert and oriented to person, place, and time.     Cranial Nerves: No cranial nerve deficit.     Coordination: Coordination normal.  Psychiatric:        Mood and Affect: Mood normal.        Behavior: Behavior normal.        Thought Content: Thought content normal.        Judgment: Judgment normal.        Assessment & Plan:  1. Routine general medical examination at a health care facility - Today patient counseled on age  appropriate routine health concerns for screening and prevention, each reviewed and up to date or declined. Immunizations reviewed and up to date or declined. Labs ordered and reviewed. Risk factors for depression reviewed and negative. Hearing function and visual acuity are intact. ADLs screened and addressed as needed. Functional ability and level of safety reviewed and appropriate. Education, counseling and referrals performed based on assessed risks today. Patient provided with a copy of personalized plan for preventive services. - Follow up in one year for CPE   2. Prostate cancer screening  - PSA; Future  3. Encounter for screening for HIV  - HIV Antibody (routine testing w rflx); Future  4. Erectile dysfunction, unspecified erectile dysfunction type  - CBC with Differential/Platelet; Future - Comprehensive metabolic panel; Future - Lipid panel; Future - TSH; Future - Testosterone Total,Free,Bio, Males; Future  5. Primary insomnia - Ambien 10 mg per pulmonary  - CBC with Differential/Platelet; Future - Lipid panel; Future - TSH; Future  6. Seasonal allergic rhinitis due to pollen - Continue with OTC   7. Chronic pruritus  - escitalopram (LEXAPRO) 5 MG tablet; Take 1 tablet (5 mg total) by mouth daily.  Dispense: 90 tablet; Refill: 3  8. Rosacea  - triamcinolone cream (KENALOG) 0.1 %; APPLY TO AFFECTED AREA TWICE A DAY  Dispense: 80 g; Refill: 0  Dorothyann Peng, NP

## 2022-09-11 NOTE — Patient Instructions (Signed)
It was great seeing you today   We will follow up with you regarding your lab work   Please let me know if you need anything   

## 2022-09-14 LAB — TESTOSTERONE TOTAL,FREE,BIO, MALES
Albumin: 4.2 g/dL (ref 3.6–5.1)
Sex Hormone Binding: 42 nmol/L (ref 22–77)
Testosterone, Bioavailable: 108.4 ng/dL — ABNORMAL LOW (ref 110.0–575.0)
Testosterone, Free: 56.3 pg/mL (ref 46.0–224.0)
Testosterone: 504 ng/dL (ref 250–827)

## 2022-09-14 LAB — HIV ANTIBODY (ROUTINE TESTING W REFLEX): HIV 1&2 Ab, 4th Generation: NONREACTIVE

## 2022-10-20 DIAGNOSIS — L821 Other seborrheic keratosis: Secondary | ICD-10-CM | POA: Diagnosis not present

## 2022-10-20 DIAGNOSIS — L578 Other skin changes due to chronic exposure to nonionizing radiation: Secondary | ICD-10-CM | POA: Diagnosis not present

## 2022-10-20 DIAGNOSIS — L814 Other melanin hyperpigmentation: Secondary | ICD-10-CM | POA: Diagnosis not present

## 2022-10-20 DIAGNOSIS — L570 Actinic keratosis: Secondary | ICD-10-CM | POA: Diagnosis not present

## 2022-10-20 DIAGNOSIS — D225 Melanocytic nevi of trunk: Secondary | ICD-10-CM | POA: Diagnosis not present

## 2022-11-01 ENCOUNTER — Other Ambulatory Visit: Payer: Self-pay | Admitting: Pulmonary Disease

## 2022-11-02 NOTE — Telephone Encounter (Signed)
Please advise on refill request

## 2022-11-06 DIAGNOSIS — M25561 Pain in right knee: Secondary | ICD-10-CM | POA: Diagnosis not present

## 2023-02-22 ENCOUNTER — Other Ambulatory Visit: Payer: Self-pay | Admitting: Adult Health

## 2023-02-22 DIAGNOSIS — N529 Male erectile dysfunction, unspecified: Secondary | ICD-10-CM

## 2023-03-04 ENCOUNTER — Other Ambulatory Visit: Payer: Self-pay | Admitting: Pulmonary Disease

## 2023-06-07 DIAGNOSIS — Z23 Encounter for immunization: Secondary | ICD-10-CM | POA: Diagnosis not present

## 2023-07-12 ENCOUNTER — Other Ambulatory Visit: Payer: Self-pay | Admitting: Pulmonary Disease

## 2023-07-16 NOTE — Telephone Encounter (Signed)
Patient requesting refill for Ambien 10mg   LOV 02/03/23  Dr.Olalere can you please advise   Thank you

## 2023-09-17 ENCOUNTER — Other Ambulatory Visit: Payer: Self-pay | Admitting: Adult Health

## 2023-09-17 ENCOUNTER — Ambulatory Visit (INDEPENDENT_AMBULATORY_CARE_PROVIDER_SITE_OTHER): Payer: BC Managed Care – PPO | Admitting: Adult Health

## 2023-09-17 VITALS — BP 100/60 | HR 69 | Temp 98.1°F | Ht 70.0 in | Wt 181.0 lb

## 2023-09-17 DIAGNOSIS — N529 Male erectile dysfunction, unspecified: Secondary | ICD-10-CM

## 2023-09-17 DIAGNOSIS — Z125 Encounter for screening for malignant neoplasm of prostate: Secondary | ICD-10-CM

## 2023-09-17 DIAGNOSIS — D61818 Other pancytopenia: Secondary | ICD-10-CM

## 2023-09-17 DIAGNOSIS — F5101 Primary insomnia: Secondary | ICD-10-CM

## 2023-09-17 DIAGNOSIS — L719 Rosacea, unspecified: Secondary | ICD-10-CM | POA: Diagnosis not present

## 2023-09-17 DIAGNOSIS — J301 Allergic rhinitis due to pollen: Secondary | ICD-10-CM | POA: Diagnosis not present

## 2023-09-17 DIAGNOSIS — Z0000 Encounter for general adult medical examination without abnormal findings: Secondary | ICD-10-CM | POA: Diagnosis not present

## 2023-09-17 LAB — LIPID PANEL
Cholesterol: 132 mg/dL (ref 0–200)
HDL: 47.2 mg/dL (ref >39.00)
LDL Cholesterol: 77 mg/dL (ref 0–99)
NonHDL: 84.78
Total CHOL/HDL Ratio: 3
Triglycerides: 41 mg/dL (ref 0.0–149.0)
VLDL: 8.2 mg/dL (ref 0.0–40.0)

## 2023-09-17 LAB — CBC
HCT: 35.5 % — ABNORMAL LOW (ref 39.0–52.0)
Hemoglobin: 11.2 g/dL — ABNORMAL LOW (ref 13.0–17.0)
MCHC: 31.4 g/dL (ref 30.0–36.0)
MCV: 77.7 fL — ABNORMAL LOW (ref 78.0–100.0)
Platelets: 144 10*3/uL — ABNORMAL LOW (ref 150.0–400.0)
RBC: 4.57 Mil/uL (ref 4.22–5.81)
RDW: 18.2 % — ABNORMAL HIGH (ref 11.5–15.5)
WBC: 2.9 10*3/uL — ABNORMAL LOW (ref 4.0–10.5)

## 2023-09-17 LAB — COMPREHENSIVE METABOLIC PANEL
ALT: 13 U/L (ref 0–53)
AST: 19 U/L (ref 0–37)
Albumin: 4.3 g/dL (ref 3.5–5.2)
Alkaline Phosphatase: 60 U/L (ref 39–117)
BUN: 15 mg/dL (ref 6–23)
CO2: 29 meq/L (ref 19–32)
Calcium: 9.2 mg/dL (ref 8.4–10.5)
Chloride: 106 meq/L (ref 96–112)
Creatinine, Ser: 1.09 mg/dL (ref 0.40–1.50)
GFR: 73.46 mL/min (ref >60.00)
Glucose, Bld: 89 mg/dL (ref 70–99)
Potassium: 4.2 meq/L (ref 3.5–5.1)
Sodium: 141 meq/L (ref 135–145)
Total Bilirubin: 0.5 mg/dL (ref 0.2–1.2)
Total Protein: 6.4 g/dL (ref 6.0–8.3)

## 2023-09-17 LAB — PSA: PSA: 1.17 ng/mL (ref 0.10–4.00)

## 2023-09-17 MED ORDER — SILDENAFIL CITRATE 100 MG PO TABS: 100.0000 mg | ORAL_TABLET | Freq: Every day | ORAL | 3 refills | Status: DC | PRN

## 2023-09-17 NOTE — Patient Instructions (Signed)
It was great seeing you today   We will follow up with you regarding your lab work   Please let me know if you need anything

## 2023-09-17 NOTE — Progress Notes (Signed)
Subjective:    Patient ID: Cody Knapp, male    DOB: [birth year], 62 y.o.   MRN: [id number]  HPI Patient presents for yearly preventative medicine examination. He is a 63 year old male who  has a past medical history of Allergy, Personal history of colonic adenoma (08/10/2013), and Rosacea.  Seasonal Allergies - Takes Allegra 180 mg PRN   Insomnia - takes Ambien 10 mg daily - managed by pulmonary   Rosacea - is followed buy Dermatology.   ED - uses Cialis  20 mg PRN. Does not feel like this works well for him. He would like to try Viagra and be referred to Urology  .  All immunizations and health maintenance protocols were reviewed with the patient and needed orders were placed.  Appropriate screening laboratory values were ordered for the patient including screening of hyperlipidemia, renal function and hepatic function. If indicated by BPH, a PSA was ordered.  Medication reconciliation,  past medical history, social history, problem list and allergies were reviewed in detail with the patient  Goals were established with regard to weight loss, exercise, and  diet in compliance with medications Wt Readings from Last 3 Encounters:  09/17/23 181 lb (82.1 kg)  09/11/22 181 lb (82.1 kg)  02/02/22 184 lb 6.4 oz (83.6 kg)    He is up to date on routine colon cancer screening   Review of Systems  Constitutional: Negative.   HENT: Negative.    Eyes: Negative.   Respiratory: Negative.    Cardiovascular: Negative.   Gastrointestinal: Negative.   Endocrine: Negative.   Genitourinary: Negative.   Musculoskeletal: Negative.   Skin: Negative.   Allergic/Immunologic: Negative.   Neurological: Negative.   Hematological: Negative.   Psychiatric/Behavioral: Negative.    All other systems reviewed and are negative.  Past Medical History:  Diagnosis Date   Allergy    Personal history of colonic adenoma 08/10/2013   Rosacea     Social History   Socioeconomic History    Marital status: Married    Spouse name: Not on file   Number of children: Not on file   Years of education: Not on file   Highest education level: Bachelor's degree (e.g., BA, AB, BS)  Occupational History   Not on file  Tobacco Use   Smoking status: Never   Smokeless tobacco: Never  Substance and Sexual Activity   Alcohol use: Yes   Drug use: Never   Sexual activity: Not on file  Other Topics Concern   Not on file  Social History Narrative   Works in Designer, fashion/clothing    Married    Three daughters ( 23, 19,16)    Social Drivers of Health   Financial Resource Strain: Low Risk  (12/15/2021)   Overall Financial Resource Strain (CARDIA)    Difficulty of Paying Living Expenses: Not hard at all  Food Insecurity: No Food Insecurity (12/15/2021)   Hunger Vital Sign    Worried About Running Out of Food in the Last Year: Never true    Ran Out of Food in the Last Year: Never true  Transportation Needs: No Transportation Needs (12/15/2021)   PRAPARE - Administrator, Civil Service (Medical): No    Lack of Transportation (Non-Medical): No  Physical Activity: Sufficiently Active (12/15/2021)   Exercise Vital Sign    Days of Exercise per Week: 4 days    Minutes of Exercise per Session: 60 min  Stress: Stress Concern Present (12/15/2021)   [employer] of Occupational  Health - Occupational Stress Questionnaire    Feeling of Stress : Very much  Social Connections: Socially Integrated (12/15/2021)   Social Connection and Isolation Panel [NHANES]    Frequency of Communication with Friends and Family: More than three times a week    Frequency of Social Gatherings with Friends and Family: Once a week    Attends Religious Services: 1 to 4 times per year    Active Member of [employer] or Organizations: Yes    Attends Banker Meetings: 1 to 4 times per year    Marital Status: Married  Catering manager Violence: Not on file    Past Surgical History:  Procedure Laterality Date    COLONOSCOPY     KNEE ARTHROSCOPY Right     Family History  Problem Relation Age of Onset   Heart disease Father        aortic vavle disease (replaced)   Colon polyps Sister    Colon cancer Neg Hx    Pancreatic cancer Neg Hx    Esophageal cancer Neg Hx    Rectal cancer Neg Hx    Stomach cancer Neg Hx     No Known Allergies  Current Outpatient Medications on File Prior to Visit  Medication Sig Dispense Refill   Doxylamine Succinate, Sleep, (SLEEP AID PO) Take by mouth.     tadalafil (CIALIS) 20 MG tablet TAKE ONE-HALF TO ONE TABLET BY MOUTH EVERY OTHER DAY AS NEEDED FOR ERECTILE DYSFUNCTION 10 tablet 0   triamcinolone cream (KENALOG) 0.1 % APPLY TO AFFECTED AREA TWICE A DAY 80 g 0   zolpidem (AMBIEN) 10 MG tablet TAKE 1 TABLET BY MOUTH AT BEDTIME AS NEEDED FOR SLEEP 30 tablet 2   [employer] SUNSCREEN SPF 30 EX apply topically to face and body daily for 30 (Patient not taking: Reported on 09/17/2023)     fexofenadine (ALLEGRA) 180 MG tablet Take 1 tablet (180 mg total) by mouth daily. (Patient not taking: Reported on 09/17/2023) 30 tablet 11   fluticasone (FLONASE) 50 MCG/ACT nasal spray Place into the nose. (Patient not taking: Reported on 09/17/2023)     No current facility-administered medications on file prior to visit.    BP 100/60   Pulse 69   Temp 98.1 F (36.7 C) (Oral)   Ht 5\' 10"  (1.778 m)   Wt 181 lb (82.1 kg)   SpO2 100%   BMI 25.97 kg/m       Objective:   Physical Exam Vitals and nursing note reviewed.  Constitutional:      General: He is not in acute distress.    Appearance: Normal appearance. He is not ill-appearing.  HENT:     Head: Normocephalic and atraumatic.     Right Ear: Tympanic membrane, ear canal and external ear normal. There is no impacted cerumen.     Left Ear: Tympanic membrane, ear canal and external ear normal. There is no impacted cerumen.     Nose: Nose normal. No congestion or rhinorrhea.     Mouth/Throat:     Mouth: Mucous membranes are  moist.     Pharynx: Oropharynx is clear.  Eyes:     Extraocular Movements: Extraocular movements intact.     Conjunctiva/sclera: Conjunctivae normal.     Pupils: Pupils are equal, round, and reactive to light.  Neck:     Vascular: No carotid bruit.  Cardiovascular:     Rate and Rhythm: Normal rate and regular rhythm.     Pulses: Normal pulses.  Heart sounds: No murmur heard.    No friction rub. No gallop.  Pulmonary:     Effort: Pulmonary effort is normal.     Breath sounds: Normal breath sounds.  Abdominal:     General: Abdomen is flat. Bowel sounds are normal. There is no distension.     Palpations: Abdomen is soft. There is no mass.     Tenderness: There is no abdominal tenderness. There is no guarding or rebound.     Hernia: No hernia is present.  Musculoskeletal:        General: Normal range of motion.     Cervical back: Normal range of motion and neck supple.  Lymphadenopathy:     Cervical: No cervical adenopathy.  Skin:    General: Skin is warm and dry.     Capillary Refill: Capillary refill takes less than 2 seconds.  Neurological:     General: No focal deficit present.     Mental Status: He is alert and oriented to person, place, and time.  Psychiatric:        Mood and Affect: Mood normal.        Behavior: Behavior normal.        Thought Content: Thought content normal.        Judgment: Judgment normal.        Assessment & Plan:  1. Routine general medical examination at a health care facility (Primary) Today patient counseled on age appropriate routine health concerns for screening and prevention, each reviewed and up to date or declined. Immunizations reviewed and up to date or declined. Labs ordered and reviewed. Risk factors for depression reviewed and negative. Hearing function and visual acuity are intact. ADLs screened and addressed as needed. Functional ability and level of safety reviewed and appropriate. Education, counseling and referrals performed  based on assessed risks today. Patient provided with a copy of personalized plan for preventive services. - Follow up in one year  - Encouraged exercise and healthy eating   2. Primary insomnia - Continue with Ambien  - Lipid panel; Future - CBC; Future - Comprehensive metabolic panel; Future  3. Seasonal allergic rhinitis due to pollen - Continue OTC - Lipid panel; Future - CBC; Future - Comprehensive metabolic panel; Future  4. Rosacea - per dermatology  - Lipid panel; Future - CBC; Future - Comprehensive metabolic panel; Future  5. Prostate cancer screening  - PSA; Future  6. Erectile dysfunction, unspecified erectile dysfunction type  - sildenafil (VIAGRA) 100 MG tablet; Take 1 tablet (100 mg total) by mouth daily as needed for erectile dysfunction.  Dispense: 10 tablet; Refill: 3 - Ambulatory referral to Urology  Shirline Frees, NP

## 2023-09-20 ENCOUNTER — Other Ambulatory Visit (INDEPENDENT_AMBULATORY_CARE_PROVIDER_SITE_OTHER): Payer: BC Managed Care – PPO

## 2023-09-20 DIAGNOSIS — D61818 Other pancytopenia: Secondary | ICD-10-CM

## 2023-09-21 ENCOUNTER — Telehealth: Payer: Self-pay

## 2023-09-21 LAB — IBC + FERRITIN
Ferritin: 4.2 ng/mL — ABNORMAL LOW (ref 22.0–322.0)
Iron: 28 ug/dL — ABNORMAL LOW (ref 42–165)
Saturation Ratios: 6.2 % — ABNORMAL LOW (ref 20.0–50.0)
TIBC: 455 ug/dL — ABNORMAL HIGH (ref 250.0–450.0)
Transferrin: 325 mg/dL (ref 212.0–360.0)

## 2023-09-21 LAB — VITAMIN B12: Vitamin B-12: 225 pg/mL (ref 211–911)

## 2023-09-21 NOTE — Telephone Encounter (Signed)
Copied from CRM [id number]. Topic: Clinical - Lab/Test Results >> Sep 21, 2023  3:06 PM Fredrich Romans wrote: Reason for CRM: patient is requesting a phone call to discuss lab results.  Patient stated that he drinks a lot of ice tea and he would like to know if that could be an issue that's causing him to have low iron

## 2023-09-21 NOTE — Telephone Encounter (Signed)
Will call pt with results after [facility] advise on question below.

## 2023-09-21 NOTE — Telephone Encounter (Signed)
Please advise

## 2023-09-22 NOTE — Telephone Encounter (Signed)
PATIENT CALLED BACK I RELAYED MESSAGE HE STILL HAS A FEW MORE QUESTIONS TO ASK

## 2023-09-22 NOTE — Telephone Encounter (Signed)
Called pt but he was at work. Pt stated he will return call.

## 2023-09-22 NOTE — Telephone Encounter (Signed)
Spoke to pt and he wasnted to know the dose to take with iron and B12. I advised pt to take the standard iron and B12 500 mcg. Please advise if needed.

## 2023-09-23 NOTE — Telephone Encounter (Signed)
Noted

## 2023-11-09 DIAGNOSIS — L570 Actinic keratosis: Secondary | ICD-10-CM | POA: Diagnosis not present

## 2023-11-09 DIAGNOSIS — L814 Other melanin hyperpigmentation: Secondary | ICD-10-CM | POA: Diagnosis not present

## 2023-11-09 DIAGNOSIS — L578 Other skin changes due to chronic exposure to nonionizing radiation: Secondary | ICD-10-CM | POA: Diagnosis not present

## 2023-11-09 DIAGNOSIS — L821 Other seborrheic keratosis: Secondary | ICD-10-CM | POA: Diagnosis not present

## 2023-11-09 DIAGNOSIS — D225 Melanocytic nevi of trunk: Secondary | ICD-10-CM | POA: Diagnosis not present

## 2023-12-06 ENCOUNTER — Other Ambulatory Visit: Payer: Self-pay | Admitting: Pulmonary Disease

## 2023-12-07 NOTE — Telephone Encounter (Signed)
**Note De-identified  [place] Obfuscation** Please advise 

## 2024-03-29 NOTE — Telephone Encounter (Signed)
 Cody Knapp

## 2024-04-27 ENCOUNTER — Ambulatory Visit: Admitting: Internal Medicine

## 2024-04-27 ENCOUNTER — Encounter: Payer: Self-pay | Admitting: Internal Medicine

## 2024-04-27 ENCOUNTER — Ambulatory Visit: Payer: Self-pay

## 2024-04-27 VITALS — BP 114/70 | HR 63 | Temp 97.7°F | Ht 70.0 in | Wt 175.4 lb

## 2024-04-27 DIAGNOSIS — R197 Diarrhea, unspecified: Secondary | ICD-10-CM

## 2024-04-27 DIAGNOSIS — E611 Iron deficiency: Secondary | ICD-10-CM

## 2024-04-27 DIAGNOSIS — Z1231 Encounter for screening mammogram for malignant neoplasm of breast: Secondary | ICD-10-CM | POA: Diagnosis not present

## 2024-04-27 LAB — HEPATIC FUNCTION PANEL
ALT: 13 U/L (ref 0–53)
AST: 19 U/L (ref 0–37)
Albumin: 4 g/dL (ref 3.5–5.2)
Alkaline Phosphatase: 75 U/L (ref 39–117)
Bilirubin, Direct: 0.1 mg/dL (ref 0.0–0.3)
Total Bilirubin: 0.4 mg/dL (ref 0.2–1.2)
Total Protein: 6.3 g/dL (ref 6.0–8.3)

## 2024-04-27 LAB — BASIC METABOLIC PANEL WITH GFR
BUN: 16 mg/dL (ref 6–23)
CO2: 30 meq/L (ref 19–32)
Calcium: 8.7 mg/dL (ref 8.4–10.5)
Chloride: 102 meq/L (ref 96–112)
Creatinine, Ser: 1.13 mg/dL (ref 0.40–1.50)
GFR: 70.05 mL/min (ref >60.00)
Glucose, Bld: 89 mg/dL (ref 70–99)
Potassium: 4.2 meq/L (ref 3.5–5.1)
Sodium: 138 meq/L (ref 135–145)

## 2024-04-27 LAB — IBC PANEL
Iron: 27 ug/dL — ABNORMAL LOW (ref 42–165)
Saturation Ratios: 6.7 % — ABNORMAL LOW (ref 20.0–50.0)
TIBC: 404.6 ug/dL (ref 250.0–450.0)
Transferrin: 289 mg/dL (ref 212.0–360.0)

## 2024-04-27 LAB — FERRITIN: Ferritin: 17 ng/mL — ABNORMAL LOW (ref 22.0–322.0)

## 2024-04-27 LAB — POC COVID19 BINAXNOW: SARS Coronavirus 2 Ag: NEGATIVE

## 2024-04-27 MED ORDER — DIPHENOXYLATE-ATROPINE 2.5-0.025 MG PO TABS: 1.0000 | ORAL_TABLET | Freq: Four times a day (QID) | ORAL | 1 refills | Status: DC | PRN

## 2024-04-27 NOTE — Assessment & Plan Note (Signed)
 Acute new onset, covid neg today, does not feel ill but has recurrent watery stools for unclear reason, now for lomotil  1 qid prn, stool panel by PCR, and lab including cbc

## 2024-04-27 NOTE — Progress Notes (Signed)
 Patient ID: Cody Knapp, male   DOB: 08-15-1961, 63 y.o.   MRN: [id number]        Chief Complaint: follow up acute watery diarrhea x 3 days       HPI:  Cody Knapp is a 63 y.o. male here with simply multiple episodes watery diarrheal stools x 3 days, and simply o/w does not feel ill, fever, chills, abd pain, n/v, or blood.  Denies worsening reflux, dysphagia.  Pt denies chest pain, increased sob or doe, wheezing, orthopnea, PND, increased LE swelling, palpitations, dizziness or syncope.   Pt denies polydipsia, polyuria, or new focal neuro s/s.   No recent antibiotics/  Appetite ok.       Wt Readings from Last 3 Encounters:  04/27/24 175 lb 6.4 oz (79.6 kg)  09/17/23 181 lb (82.1 kg)  09/11/22 181 lb (82.1 kg)   BP Readings from Last 3 Encounters:  04/27/24 114/70  09/17/23 100/60  09/11/22 120/78         Past Medical History:  Diagnosis Date   Allergy    Personal history of colonic adenoma 08/10/2013   Rosacea    Past Surgical History:  Procedure Laterality Date   COLONOSCOPY     KNEE ARTHROSCOPY Right     reports that he has never smoked. He has never used smokeless tobacco. He reports current alcohol use. He reports that he does not use drugs. family history includes Colon polyps in his sister; Heart disease in his father. No Known Allergies Current Outpatient Medications on File Prior to Visit  Medication Sig Dispense Refill   Doxylamine Succinate, Sleep, (SLEEP AID PO) Take by mouth.     sildenafil  (VIAGRA ) 100 MG tablet Take 1 tablet (100 mg total) by mouth daily as needed for erectile dysfunction. 10 tablet 3   triamcinolone  cream (KENALOG ) 0.1 % APPLY TO AFFECTED AREA TWICE A DAY 80 g 0   zolpidem  (AMBIEN ) 10 MG tablet TAKE 1 TABLET BY MOUTH EVERY DAY AT BEDTIME AS NEEDED FOR SLEEP 30 tablet 2   No current facility-administered medications on file prior to visit.        ROS:  All others reviewed and negative.  Objective        PE:  BP 114/70   Pulse 63    Temp 97.7 F (36.5 C)   Ht 5' 10 (1.778 m)   Wt 175 lb 6.4 oz (79.6 kg)   SpO2 99%   BMI 25.17 kg/m                 Constitutional: Pt appears in NAD               HENT: Head: NCAT.                Right Ear: External ear normal.                 Left Ear: External ear normal.                Eyes: . Pupils are equal, round, and reactive to light. Conjunctivae and EOM are normal               Nose: without d/c or deformity               Neck: Neck supple. Gross normal ROM               Cardiovascular: Normal rate and regular rhythm.  Pulmonary/Chest: Effort normal and breath sounds without rales or wheezing.                Abd:  Soft, NT, ND, + BS, no organomegaly               Neurological: Pt is alert. At baseline orientation, motor grossly intact               Skin: Skin is warm. No rashes, no other new lesions, LE edema - none               Psychiatric: Pt behavior is normal without agitation   Micro: none  Cardiac tracings I have personally interpreted today:  none  Pertinent Radiological findings (summarize): none   Lab Results  Component Value Date   WBC 2.9 (L) 09/17/2023   HGB 11.2 (L) 09/17/2023   HCT 35.5 (L) 09/17/2023   PLT 144.0 (L) 09/17/2023   GLUCOSE 89 09/17/2023   CHOL 132 09/17/2023   TRIG 41.0 09/17/2023   HDL 47.20 09/17/2023   LDLCALC 77 09/17/2023   ALT 13 09/17/2023   AST 19 09/17/2023   NA 141 09/17/2023   K 4.2 09/17/2023   CL 106 09/17/2023   CREATININE 1.09 09/17/2023   BUN 15 09/17/2023   CO2 29 09/17/2023   TSH 2.44 09/11/2022   PSA 1.17 09/17/2023   POCT - Covid - negative  Assessment/Plan:  Cody Knapp is a 63 y.o. White or Caucasian [1] male with  has a past medical history of Allergy, Personal history of colonic adenoma (08/10/2013), and Rosacea.  Diarrhea Acute new onset, covid neg today, does not feel ill but has recurrent watery stools for unclear reason, now for lomotil  1 qid prn, stool panel by PCR, and  lab including cbc  Followup: Return if symptoms worsen or fail to improve.  Cody Rush, MD 04/27/2024 7:56 PM [facility]

## 2024-04-27 NOTE — Patient Instructions (Signed)
 Please take all new medication as prescribed - the lomotil  as needed for diarrhea  Please continue all other medications as before, and refills have been done if requested.  Please have the pharmacy call with any other refills you may need.  Please keep your appointments with your specialists as you may have planned  Please go to the LAB at the blood drawing area for the tests to be done - blood testing and stool test  You will be contacted by phone if any changes need to be made immediately.  Otherwise, you will receive a letter about your results with an explanation, but please check with MyChart first.

## 2024-04-27 NOTE — Telephone Encounter (Signed)
 FYI Only or Action Required?: FYI only for provider.  Patient was last seen in primary care on 09/17/2023 by Merna Huxley, NP.  Called Nurse Triage reporting Diarrhea and vocal stomach.  Symptoms began several days ago.  Interventions attempted: Rest, hydration, or home remedies.  Symptoms are: gradually worsening.  Triage Disposition: See Physician Within 24 Hours  Patient/caregiver understands and will follow disposition?: Yes      Message from Mia F sent at 04/27/2024  1:48 PM EDT  Pt says he has had constant diarrhea for the last two days. It started from being of and on to being consistent. He states it is happening each time he eats. No other symptoms.   Reason for Disposition  [1] MODERATE diarrhea (e.g., 4-6 times / day more than normal) AND [2] present > 48 hours (2 days)  Answer Assessment - Initial Assessment Questions 1. DIARRHEA SEVERITY: How bad is the diarrhea? How many more stools have you had in the past 24 hours than normal?      5-6x/day at night as well, mostly after eating, immediately stomach very active, vocal, can hear it, not painful though 2. ONSET: When did the diarrhea begin?      Past few days since Monday 3. STOOL DESCRIPTION:  How loose or watery is the diarrhea? What is the stool color? Is there any blood or mucous in the stool?     Both, pretty watery lately No blood or mucus 4. VOMITING: Are you also vomiting? If Yes, ask: How many times in the past 24 hours?      no 5. ABDOMEN PAIN: Are you having any abdomen pain? If Yes, ask: What does it feel like? (e.g., crampy, dull, intermittent, constant)      No pain 7. ORAL INTAKE: If vomiting, Have you been able to drink liquids? How much liquids have you had in the past 24 hours?     Drinking lots of fluids 8. HYDRATION: Any signs of dehydration? (e.g., dry mouth [not just dry lips], too weak to stand, dizziness, new weight loss) When did you last urinate?     No  dizziness, weakness, no decrease in weight so far 9. EXPOSURE: Have you traveled to a foreign country recently? Have you been exposed to anyone with diarrhea? Could you have eaten any food that was spoiled?     no 10. ANTIBIOTIC USE: Are you taking antibiotics now or have you taken antibiotics in the past 2 months?       no 11. OTHER SYMPTOMS: Do you have any other symptoms? (e.g., fever, blood in stool)       No fever  Stomach very gurgley very vocal     Advised half-strength if propel/hydration drinks. Advised exam in next 24 hours, no availability with PCP, scheduled for this afternoon with office in region [place]. Advised call back if worsening.  Protocols used: [facility]

## 2024-04-28 ENCOUNTER — Ambulatory Visit: Payer: Self-pay | Admitting: Internal Medicine

## 2024-04-28 DIAGNOSIS — K900 Celiac disease: Secondary | ICD-10-CM | POA: Diagnosis not present

## 2024-04-28 DIAGNOSIS — R197 Diarrhea, unspecified: Secondary | ICD-10-CM

## 2024-04-28 LAB — URINALYSIS, ROUTINE W REFLEX MICROSCOPIC
Bilirubin Urine: NEGATIVE
Hgb urine dipstick: NEGATIVE
Ketones, ur: NEGATIVE
Leukocytes,Ua: NEGATIVE
Nitrite: NEGATIVE
Specific Gravity, Urine: 1.015 (ref 1.000–1.030)
Total Protein, Urine: NEGATIVE
Urine Glucose: NEGATIVE
Urobilinogen, UA: 0.2 (ref 0.0–1.0)
pH: 6 (ref 5.0–8.0)

## 2024-04-28 LAB — CBC WITH DIFFERENTIAL/PLATELET
Basophils Absolute: 0 K/uL (ref 0.0–0.1)
Basophils Relative: 0.3 % (ref 0.0–3.0)
Eosinophils Absolute: 1.3 K/uL — ABNORMAL HIGH (ref 0.0–0.7)
Eosinophils Relative: 26.3 % — ABNORMAL HIGH (ref 0.0–5.0)
HCT: 34.7 % — ABNORMAL LOW (ref 39.0–52.0)
Hemoglobin: 11.2 g/dL — ABNORMAL LOW (ref 13.0–17.0)
Lymphocytes Relative: 20.1 % (ref 12.0–46.0)
Lymphs Abs: 1 K/uL (ref 0.7–4.0)
MCHC: 32.2 g/dL (ref 30.0–36.0)
MCV: 81.1 fl (ref 78.0–100.0)
Monocytes Absolute: 0.4 K/uL (ref 0.1–1.0)
Monocytes Relative: 7.9 % (ref 3.0–12.0)
Neutro Abs: 2.3 K/uL (ref 1.4–7.7)
Neutrophils Relative %: 45.4 % (ref 43.0–77.0)
Platelets: 148 K/uL — ABNORMAL LOW (ref 150.0–400.0)
RBC: 4.28 Mil/uL (ref 4.22–5.81)
RDW: 17.7 % — ABNORMAL HIGH (ref 11.5–15.5)
WBC: 5.1 K/uL (ref 4.0–10.5)

## 2024-04-28 NOTE — Addendum Note (Signed)
 Addended by: CLAUDENE BOBBETTE RAMAN on: 04/28/2024 09:06 AM   Modules accepted: Orders

## 2024-04-30 LAB — GI PROFILE, STOOL, PCR

## 2024-05-01 ENCOUNTER — Ambulatory Visit: Payer: Self-pay

## 2024-05-01 NOTE — Telephone Encounter (Signed)
 FYI Only or Action Required?: Action required by provider: update on patient condition.  Patient was last seen in primary care on 04/27/2024 by Norleen Lynwood ORN, MD.  Called Nurse Triage reporting Diarrhea.  Symptoms began a week ago.  Interventions attempted: Prescription medications: lomotil .  Symptoms are: gradually improving.  Triage Disposition: See PCP When Office is Open (Within 3 Days)  Patient/caregiver understands and will follow disposition?: Yes   Reason for Disposition  [1] MILD diarrhea (e.g., 1-3 or more stools than normal in past 24 hours) AND [2] present >  7 days  (Exception: Chronic diarrhea that is not worse.)  Answer Assessment - Initial Assessment Questions 1. DIARRHEA SEVERITY: How bad is the diarrhea? How many more stools have you had in the past 24 hours than normal?      3 times today but small amounts, previously had been 5-6 times per day 2. ONSET: When did the diarrhea begin?      One week ago 3. STOOL DESCRIPTION:  How loose or watery is the diarrhea? What is the stool color? Is there any blood or mucous in the stool?     Some diarrhea 4. VOMITING: Are you also vomiting? If Yes, ask: How many times in the past 24 hours?      denies 5. ABDOMEN PAIN: Are you having any abdomen pain? If Yes, ask: What does it feel like? (e.g., crampy, dull, intermittent, constant)      denies 6. ABDOMEN PAIN SEVERITY: If present, ask: How bad is the pain?  (e.g., Scale 1-10; mild, moderate, or severe)      0 7. ORAL INTAKE: If vomiting, Have you been able to drink liquids? How much liquids have you had in the past 24 hours?     Drinking  8. HYDRATION: Any signs of dehydration? (e.g., dry mouth [not just dry lips], too weak to stand, dizziness, new weight loss) When did you last urinate?     Hydrated  9. EXPOSURE: Have you traveled to a foreign country recently? Have you been exposed to anyone with diarrhea? Could you have eaten any food  that was spoiled?     denies 10. ANTIBIOTIC USE: Are you taking antibiotics now or have you taken antibiotics in the past 2 months?        11. OTHER SYMPTOMS: Do you have any other symptoms? (e.g., fever, blood in stool)     denies  Protocols used: Diarrhea-[facility]

## 2024-05-01 NOTE — Telephone Encounter (Signed)
 Called pt - LMOM to return our call.     Patient still having diarrhea since last week. There were labs and stool sample performed. Patient said tests came back negative.

## 2024-05-02 NOTE — Addendum Note (Signed)
 Addended by: NORLEEN LYNWOOD ORN on: 05/02/2024 02:29 PM   Modules accepted: Orders

## 2024-05-02 NOTE — Telephone Encounter (Signed)
 Ok this is done

## 2024-05-03 ENCOUNTER — Ambulatory Visit: Admitting: Adult Health

## 2024-05-04 ENCOUNTER — Ambulatory Visit: Payer: Self-pay

## 2024-05-04 ENCOUNTER — Ambulatory Visit (INDEPENDENT_AMBULATORY_CARE_PROVIDER_SITE_OTHER): Admitting: Adult Health

## 2024-05-04 VITALS — BP 100/64 | HR 57 | Temp 97.9°F | Ht 70.0 in | Wt 174.0 lb

## 2024-05-04 DIAGNOSIS — R197 Diarrhea, unspecified: Secondary | ICD-10-CM | POA: Diagnosis not present

## 2024-05-04 NOTE — Progress Notes (Unsigned)
 Ellouise Console, PA-C [address] [place], [place]  [address] Phone: [phone]   Gastroenterology Consultation  Referring Provider:     Norleen Lynwood ORN, MD Primary Care Physician:  Merna Huxley, NP Primary Gastroenterologist:  Ellouise Console, PA-C / Lupita Commander, MD / Dr. Wilhelmenia Reason for Consultation:     Diarrhea, iron deficiency anemia, Weight Loss        HPI:   Cody Knapp is a 63 y.o. y/o male referred for consultation & management  by Merna Huxley, NP.    He saw his PCP 04/27/24 with a 3-day history of acute diarrhea. He is Still having diarrhea with 4 or 5 of loose and watery stools daily.  Has taken Lomotil  with mild benefit.  He denies abdominal pain, nausea, vomiting, fever, chills, melena or hematochezia.  He has had some mild weight loss.  He denies any recent antibiotics, new medication, travel, or sick exposures.  No family history of celiac or IBD.  Rarely takes NSAID.  He is currently taking iron tablet every other day for iron deficiency anemia.  He donates blood at the [facility].  He last donated blood end of August.  04/28/2024 GI pathogen panel PCR: Negative for all pathogens.  04/27/2024: Labs showed iron deficiency anemia with Hgb 11.2, MCV 81, low iron 27, iron saturation 6.7, ferritin 17.  Thrombocytopenia with platelet 148.  He has history of chronic thrombocytopenia for 20 years.  Leukopenia for 20 years.  07/2018 colonoscopy by Dr. Commander: Good prep.  3 diminutive sessile hyperplastic polyps removed.  7-year repeat colonoscopy recommended (due 07/2025).  07/2013 colonoscopy: Sessile serrated colon polyp.   02/02/2022 09/11/2022 09/17/2023 04/27/2024  Weight /BMI      Weight 184 lb 6.4 oz  181 lb  181 lb  175 lb 6.4 oz   Height 5' 10 (1.778 m)  5' 9.5 (1.765 m)  5' 10 (1.778 m)  5' 10 (1.778 m)   BMI 26.46 kg/m2  26.35 kg/m2  25.97 kg/m2  25.17 kg/m2     05/04/2024 05/05/2024  Weight /BMI    Weight 174 lb  170 lb 6 oz   Height 5' 10 (1.778  m)  5' 10 (1.778 m)   BMI 24.97 kg/m2  24.45 kg/m2     Past Medical History:  Diagnosis Date   Allergy    Personal history of colonic adenoma 08/10/2013   Rosacea     Past Surgical History:  Procedure Laterality Date   COLONOSCOPY     KNEE ARTHROSCOPY Right     Prior to Admission medications   Medication Sig Start Date End Date Taking? Authorizing Provider  diphenoxylate -atropine  (LOMOTIL ) 2.5-0.025 MG tablet Take 1 tablet by mouth 4 (four) times daily as needed for diarrhea or loose stools. 04/27/24   Norleen Lynwood ORN, MD  Doxylamine Succinate, Sleep, (SLEEP AID PO) Take by mouth.    [provider]  sildenafil  (VIAGRA ) 100 MG tablet Take 1 tablet (100 mg total) by mouth daily as needed for erectile dysfunction. 09/17/23   Nafziger, Huxley, NP  triamcinolone  cream (KENALOG ) 0.1 % APPLY TO AFFECTED AREA TWICE A DAY 09/11/22   Nafziger, Huxley, NP  zolpidem  (AMBIEN ) 10 MG tablet TAKE 1 TABLET BY MOUTH EVERY DAY AT BEDTIME AS NEEDED FOR SLEEP 12/07/23   Neda Jennet LABOR, MD    Family History  Problem Relation Age of Onset   Heart disease Father        aortic vavle disease (replaced)  Colon polyps Sister    Colon cancer Neg Hx    Pancreatic cancer Neg Hx    Esophageal cancer Neg Hx    Rectal cancer Neg Hx    Stomach cancer Neg Hx      Social History   Tobacco Use   Smoking status: Never   Smokeless tobacco: Never  Substance Use Topics   Alcohol use: Yes    Comment: social   Drug use: Never    Allergies as of 05/05/2024   (No Known Allergies)    Review of Systems:    All systems reviewed and negative except where noted in HPI.   Physical Exam:  BP 104/66   Pulse 74   Ht 5' 10 (1.778 m)   Wt 170 lb 6 oz (77.3 kg)   SpO2 95%   BMI 24.45 kg/m  No LMP for male patient.  General:   Alert,  Well-developed, well-nourished, pleasant and cooperative in NAD Lungs:  Respirations even and unlabored.  Clear throughout to auscultation.   No wheezes, crackles, or  rhonchi. No acute distress. Heart:  Regular rate and rhythm; no murmurs, clicks, rubs, or gallops. Abdomen:  Normal bowel sounds.  No bruits.  Soft, and non-distended without masses, hepatosplenomegaly or hernias noted.  No Tenderness.  No guarding or rebound tenderness.    Neurologic:  Alert and oriented x3;  grossly normal neurologically. Psych:  Alert and cooperative. Normal mood and affect.  Imaging Studies: No results found.  Labs: CBC    Component Value Date/Time   WBC 5.1 04/27/2024 1542   RBC 4.28 04/27/2024 1542   HGB 11.2 (L) 04/27/2024 1542   HCT 34.7 (L) 04/27/2024 1542   PLT 148.0 (L) 04/27/2024 1542   MCV 81.1 04/27/2024 1542    CMP     Component Value Date/Time   NA 138 04/27/2024 1542   K 4.2 04/27/2024 1542   CL 102 04/27/2024 1542   CO2 30 04/27/2024 1542   GLUCOSE 89 04/27/2024 1542   GLUCOSE 84 07/29/2006 0934   BUN 16 04/27/2024 1542   CREATININE 1.13 04/27/2024 1542   CALCIUM 8.7 04/27/2024 1542   PROT 6.3 04/27/2024 1542   ALBUMIN 4.0 04/27/2024 1542   AST 19 04/27/2024 1542   ALT 13 04/27/2024 1542   ALKPHOS 75 04/27/2024 1542   BILITOT 0.4 04/27/2024 1542   GFRNONAA 75.99 06/11/2010 0925    Assessment and Plan:   Cody Knapp is a 63 y.o. y/o male has been referred for:  1.  Diarrhea: Recent GI pathogen panel negative for infectious pathogens. - Labs CBC, celiac panel - Schedule colonoscopy and EGD.  Evaluate for celiac, IBD, and microscopic colitis.  2.  Iron deficiency anemia - Scheduling EGD & Colonoscopy I discussed risks of EGD and colonoscopy with patient to include risk of bleeding, perforation, and risk of sedation.  Patient expressed understanding and agrees to proceed with procedures. - Avoid donating blood at the [facility] until this resolves. - Continue iron tablet every other day.  3.  Weight Loss / Chronic Thrombocytopenia  - Labs CBC - Abdominal ultrasound  Follow up with Dr. Avram after procedures.  Ellouise Console, PA-C    [facility] Attending   I agree with the Advanced Practitioner's note, impression and recommendations with the following additions:     Latest Ref Rng & Units 05/05/2024   11:39 AM 04/27/2024    3:42 PM 09/17/2023    7:28 AM  CBC  WBC 4.0 - 10.5 K/uL  6.0  5.1  2.9   Hemoglobin 13.0 - 17.0 g/dL 88.4  88.7  88.7   Hematocrit 39.0 - 52.0 % 35.6  34.7  35.5   Platelets 150.0 - 400.0 K/uL 147.0  148.0  144.0     Marked eosinophilia noted.  Eosinophilic gastroenteritis or colitis is in the differential.  Iron deficiency could be related to chronic blood donation though certainly other possible etiologies.  Lupita CHARLENA Commander, MD, NOLIA

## 2024-05-04 NOTE — Telephone Encounter (Signed)
 Ok to continue, but he may wish to go t the ED for signs of dehydration such as dizziness, weakness or worsening fever, chills, abd pain or blood.  thanks

## 2024-05-04 NOTE — Progress Notes (Unsigned)
 Subjective:    Patient ID: Cody Knapp, male    DOB: 15-Jul-1961, 64 y.o.   MRN: [id number]  HPI  Discussed the use of AI scribe software for clinical note transcription with the patient, who gave verbal consent to proceed.  History of Present Illness   Cody Knapp is a 63 year old male who presents with persistent watery diarrhea.  He has experienced watery stools with every bowel movement for over a week. There is no fever, chills, abdominal pain, nausea, vomiting, or blood in the stool. Stool, urine, and blood tests, including a stool PCR test were done last weel when he saw another provider. Stool PCR was negative for viral or bacterial origin. His CBC showed a mild anemia and elevated Eosinophils of 26.3. He was placed on Lomotil  three times daily PRN at this time and has not had any significant relief.   He recalls no unusual dietary intake or travel to exotic locations. A recent trip to [place] and [place] involved no unusual consumption.   He undergoes regular colonoscopies every five years, with the next scheduled at the end of the year, marking a six-year interval. There is no history of Crohn's disease or ulcerative colitis, though he often experiences a 'rumbling' stomach.       Review of Systems See HPI   Past Medical History:  Diagnosis Date   Allergy    Personal history of colonic adenoma 08/10/2013   Rosacea     Social History   Socioeconomic History   Marital status: Married    Spouse name: Not on file   Number of children: Not on file   Years of education: Not on file   Highest education level: Bachelor's degree (e.g., BA, AB, BS)  Occupational History   Not on file  Tobacco Use   Smoking status: Never   Smokeless tobacco: Never  Substance and Sexual Activity   Alcohol use: Yes   Drug use: Never   Sexual activity: Not on file  Other Topics Concern   Not on file  Social History Narrative   Works in Designer, fashion/clothing    Married    Three  daughters ( 23, 19,16)    Social Drivers of Health   Financial Resource Strain: Low Risk  (12/15/2021)   Overall Financial Resource Strain (CARDIA)    Difficulty of Paying Living Expenses: Not hard at all  Food Insecurity: No Food Insecurity (12/15/2021)   Hunger Vital Sign    Worried About Running Out of Food in the Last Year: Never true    Ran Out of Food in the Last Year: Never true  Transportation Needs: No Transportation Needs (12/15/2021)   PRAPARE - Administrator, Civil Service (Medical): No    Lack of Transportation (Non-Medical): No  Physical Activity: Sufficiently Active (12/15/2021)   Exercise Vital Sign    Days of Exercise per Week: 4 days    Minutes of Exercise per Session: 60 min  Stress: Stress Concern Present (12/15/2021)   [employer] of Occupational Health - Occupational Stress Questionnaire    Feeling of Stress : Very much  Social Connections: Socially Integrated (12/15/2021)   Social Connection and Isolation Panel    Frequency of Communication with Friends and Family: More than three times a week    Frequency of Social Gatherings with Friends and Family: Once a week    Attends Religious Services: 1 to 4 times per year    Active Member of [employer] or Organizations: Yes  Attends Banker Meetings: 1 to 4 times per year    Marital Status: Married  Catering manager Violence: Not on file    Past Surgical History:  Procedure Laterality Date   COLONOSCOPY     KNEE ARTHROSCOPY Right     Family History  Problem Relation Age of Onset   Heart disease Father        aortic vavle disease (replaced)   Colon polyps Sister    Colon cancer Neg Hx    Pancreatic cancer Neg Hx    Esophageal cancer Neg Hx    Rectal cancer Neg Hx    Stomach cancer Neg Hx     No Known Allergies  Current Outpatient Medications on File Prior to Visit  Medication Sig Dispense Refill   diphenoxylate -atropine  (LOMOTIL ) 2.5-0.025 MG tablet Take 1 tablet by mouth 4  (four) times daily as needed for diarrhea or loose stools. 40 tablet 1   Doxylamine Succinate, Sleep, (SLEEP AID PO) Take by mouth.     sildenafil  (VIAGRA ) 100 MG tablet Take 1 tablet (100 mg total) by mouth daily as needed for erectile dysfunction. 10 tablet 3   triamcinolone  cream (KENALOG ) 0.1 % APPLY TO AFFECTED AREA TWICE A DAY 80 g 0   zolpidem  (AMBIEN ) 10 MG tablet TAKE 1 TABLET BY MOUTH EVERY DAY AT BEDTIME AS NEEDED FOR SLEEP 30 tablet 2   No current facility-administered medications on file prior to visit.    BP 100/64   Pulse (!) 57   Temp 97.9 F (36.6 C) (Oral)   Ht 5' 10 (1.778 m)   Wt 174 lb (78.9 kg)   SpO2 99%   BMI 24.97 kg/m       Objective:   Physical Exam Vitals and nursing note reviewed.  Constitutional:      Appearance: Normal appearance.  Cardiovascular:     Rate and Rhythm: Normal rate and regular rhythm.     Pulses: Normal pulses.     Heart sounds: Normal heart sounds.  Pulmonary:     Effort: Pulmonary effort is normal.     Breath sounds: Normal breath sounds.  Musculoskeletal:        General: Normal range of motion.  Skin:    General: Skin is warm and dry.  Neurological:     General: No focal deficit present.     Mental Status: He is alert and oriented to person, place, and time.  Psychiatric:        Mood and Affect: Mood normal.        Behavior: Behavior normal.        Thought Content: Thought content normal.        Judgment: Judgment normal.        Assessment & Plan:  1. Diarrhea, unspecified type (Primary) -  Prior to coming into the office today he was able to get his GI appointment moved up to tomorrow morning. At this time we cannot r/o parasitic cause of diarrhea, especially with elevated Eosinophils. We will order Ova and Parasite test today and he will wait until he sees GI for further treatment.  - Ova and Parasite Exam; Future  Darleene Shape, NP  I personally spent a total of 34 minutes in the care of the patient today  including preparing to see the patient, getting/reviewing separately obtained history, performing a medically appropriate exam/evaluation, placing orders, documenting clinical information in the EHR, and independently interpreting results.

## 2024-05-04 NOTE — Telephone Encounter (Signed)
 FYI Only or Action Required?: FYI only for provider.  Patient was last seen in primary care on 04/27/2024 by Norleen Lynwood ORN, MD.  Called Nurse Triage reporting Diarrhea.  Symptoms began x 10 to 11 day.  Interventions attempted: Prescription medications: medication to help reduce diarrhea and pain.  Symptoms are: unchanged.  Triage Disposition: See PCP When Office is Open (Within 3 Days)  Patient/caregiver understands and will follow disposition?: Yes    Copied from CRM #[id number]. Topic: Clinical - Red Word Triage >> May 04, 2024  8:24 AM Cody Knapp wrote: Red Word that prompted transfer to Nurse Triage: Patient was seen on  04/27/2024 due to experiencing diarrhea, symptoms have not improved. Reason for Disposition  [1] MILD diarrhea (e.g., 1-3 or more stools than normal in past 24 hours) AND [2] present >  7 days  (Exception: Chronic diarrhea that is not worse.)  Answer Assessment - Initial Assessment Questions 1. DIARRHEA SEVERITY: How bad is the diarrhea? How many more stools have you had in the past 24 hours than normal?      Ongoing since prior to LOV on 04/27/2024 2. ONSET: When did the diarrhea begin?      X 10 - 11 days 3. STOOL DESCRIPTION:  How loose or watery is the diarrhea? What is the stool color? Is there any blood or mucous in the stool?     Watery loose stools 4. VOMITING: Are you also vomiting? If Yes, ask: How many times in the past 24 hours?      no 5. ABDOMEN PAIN: Are you having any abdomen pain? If Yes, ask: What does it feel like? (e.g., crampy, dull, intermittent, constant)     2 or 3/10 cramping 6. ABDOMEN PAIN SEVERITY: If present, ask: How bad is the pain?  (e.g., Scale 1-10; mild, moderate, or severe)     mild 7. ORAL INTAKE: If vomiting, Have you been able to drink liquids? How much liquids have you had in the past 24 hours?     Drinking fluids 8. HYDRATION: Any signs of dehydration? (e.g., dry mouth [not just dry lips], too weak  to stand, dizziness, new weight loss) When did you last urinate?     no 9. EXPOSURE: Have you traveled to a foreign country recently? Have you been exposed to anyone with diarrhea? Could you have eaten any food that was spoiled?     no 10. ANTIBIOTIC USE: Are you taking antibiotics now or have you taken antibiotics in the past 2 months?       no 11. OTHER SYMPTOMS: Do you have any other symptoms? (e.g., fever, blood in stool) no     12PR EGNANCY: Is there any chance you are pregnant? When was your last menstrual period?       Na   Pt stated he has lost about 5 lbs since the diarrhea started.  Protocols used: [facility]

## 2024-05-04 NOTE — Telephone Encounter (Signed)
 Pt has been seen

## 2024-05-04 NOTE — Telephone Encounter (Signed)
 Copied from CRM [id number]. Topic: Referral - Status >> May 02, 2024 10:25 AM Charolett L wrote: Reason for CRM: patient is calling for a referral status for a GI doctor

## 2024-05-05 ENCOUNTER — Other Ambulatory Visit (INDEPENDENT_AMBULATORY_CARE_PROVIDER_SITE_OTHER)

## 2024-05-05 ENCOUNTER — Ambulatory Visit: Admitting: Physician Assistant

## 2024-05-05 ENCOUNTER — Other Ambulatory Visit

## 2024-05-05 ENCOUNTER — Encounter: Payer: Self-pay | Admitting: Physician Assistant

## 2024-05-05 VITALS — BP 104/66 | HR 74 | Ht 70.0 in | Wt 170.4 lb

## 2024-05-05 DIAGNOSIS — D696 Thrombocytopenia, unspecified: Secondary | ICD-10-CM

## 2024-05-05 DIAGNOSIS — R197 Diarrhea, unspecified: Secondary | ICD-10-CM | POA: Diagnosis not present

## 2024-05-05 DIAGNOSIS — D509 Iron deficiency anemia, unspecified: Secondary | ICD-10-CM

## 2024-05-05 DIAGNOSIS — R634 Abnormal weight loss: Secondary | ICD-10-CM | POA: Diagnosis not present

## 2024-05-05 LAB — CBC WITH DIFFERENTIAL/PLATELET
Basophils Absolute: 0 K/uL (ref 0.0–0.1)
Basophils Relative: 0.8 % (ref 0.0–3.0)
Eosinophils Absolute: 2.3 K/uL — ABNORMAL HIGH (ref 0.0–0.7)
Eosinophils Relative: 37.7 % — ABNORMAL HIGH (ref 0.0–5.0)
HCT: 35.6 % — ABNORMAL LOW (ref 39.0–52.0)
Hemoglobin: 11.5 g/dL — ABNORMAL LOW (ref 13.0–17.0)
Lymphocytes Relative: 17.8 % (ref 12.0–46.0)
Lymphs Abs: 1.1 K/uL (ref 0.7–4.0)
MCHC: 32.2 g/dL (ref 30.0–36.0)
MCV: 80.6 fl (ref 78.0–100.0)
Monocytes Absolute: 0.5 K/uL (ref 0.1–1.0)
Monocytes Relative: 8.5 % (ref 3.0–12.0)
Neutro Abs: 2.1 K/uL (ref 1.4–7.7)
Neutrophils Relative %: 35.2 % — ABNORMAL LOW (ref 43.0–77.0)
Platelets: 147 K/uL — ABNORMAL LOW (ref 150.0–400.0)
RBC: 4.41 Mil/uL (ref 4.22–5.81)
RDW: 17.6 % — ABNORMAL HIGH (ref 11.5–15.5)
WBC: 6 K/uL (ref 4.0–10.5)

## 2024-05-05 MED ORDER — NA SULFATE-K SULFATE-MG SULF 17.5-3.13-1.6 GM/177ML PO SOLN: 1.0000 | Freq: Once | ORAL | 0 refills | Status: AC

## 2024-05-05 NOTE — Patient Instructions (Signed)
 Your provider has requested that you go to the basement level for lab work before leaving today. Press B on the elevator. The lab is located at the first door on the left as you exit the elevator.  You have been scheduled for an abdominal ultrasound at [facility] (1st floor of hospital) on 05/08/24 at 10:30 am. Please arrive 30 minutes prior to your appointment for registration. Make certain not to have anything to eat or drink after midnight prior to your appointment. Should you need to reschedule your appointment, please contact radiology at [phone]. This test typically takes about 30 minutes to perform.   You have been scheduled for an Endoscopy and Colonoscopy. Please follow the written instructions given to you at your visit today.  If you use inhalers (even only as needed), please bring them with you on the day of your procedure.  DO NOT TAKE 7 DAYS PRIOR TO TEST- Trulicity (dulaglutide) Ozempic, Wegovy (semaglutide) Mounjaro (tirzepatide) Bydureon Bcise (exanatide extended release)  DO NOT TAKE 1 DAY PRIOR TO YOUR TEST Rybelsus (semaglutide) Adlyxin (lixisenatide) Victoza (liraglutide) Byetta (exanatide) ___________________________________________________________________________  Please follow up sooner if symptoms increase or worsen __________________________________________________________________________  Due to recent changes in healthcare laws, you may see the results of your imaging and laboratory studies on MyChart before your provider has had a chance to review them.  We understand that in some cases there may be results that are confusing or concerning to you. Not all laboratory results come back in the same time frame and the provider may be waiting for multiple results in order to interpret others.  Please give us  48 hours in order for your provider to thoroughly review all the results before contacting the office for clarification of your results.    Thank you for trusting me with your gastrointestinal care!   Ellouise Console, PA-C _______________________________________________________  If your blood pressure at your visit was 140/90 or greater, please contact your primary care physician to follow up on this.  _______________________________________________________  If you are age 15 or older, your body mass index should be between 23-30. Your Body mass index is 24.45 kg/m. If this is out of the aforementioned range listed, please consider follow up with your Primary Care Provider.  If you are age 88 or younger, your body mass index should be between 19-25. Your Body mass index is 24.45 kg/m. If this is out of the aformentioned range listed, please consider follow up with your Primary Care Provider.   ________________________________________________________  The [facility] providers would like to encourage you to use MYCHART to communicate with providers for non-urgent requests or questions.  Due to long hold times on the telephone, sending your provider a message by [facility] may be a faster and more efficient way to get a response.  Please allow 48 business hours for a response.  Please remember that this is for non-urgent requests.  _______________________________________________________

## 2024-05-06 NOTE — Progress Notes (Signed)
 Happy to be available for procedures.

## 2024-05-08 ENCOUNTER — Ambulatory Visit (HOSPITAL_COMMUNITY)
Admission: RE | Admit: 2024-05-08 | Discharge: 2024-05-08 | Disposition: A | Discharge status: Home / Self Care | Source: Ambulatory Visit | Attending: Physician Assistant | Admitting: Physician Assistant

## 2024-05-08 DIAGNOSIS — D696 Thrombocytopenia, unspecified: Secondary | ICD-10-CM | POA: Insufficient documentation

## 2024-05-09 ENCOUNTER — Ambulatory Visit: Admitting: Gastroenterology

## 2024-05-09 ENCOUNTER — Ambulatory Visit (AMBULATORY_SURGERY_CENTER): Admitting: Gastroenterology

## 2024-05-09 ENCOUNTER — Encounter: Payer: Self-pay | Admitting: Gastroenterology

## 2024-05-09 VITALS — BP 125/82 | HR 67 | Temp 97.2°F | Resp 11 | Ht 70.0 in | Wt 170.0 lb

## 2024-05-09 DIAGNOSIS — K641 Second degree hemorrhoids: Secondary | ICD-10-CM | POA: Diagnosis not present

## 2024-05-09 DIAGNOSIS — D122 Benign neoplasm of ascending colon: Secondary | ICD-10-CM | POA: Diagnosis not present

## 2024-05-09 DIAGNOSIS — K2950 Unspecified chronic gastritis without bleeding: Secondary | ICD-10-CM | POA: Diagnosis not present

## 2024-05-09 DIAGNOSIS — D509 Iron deficiency anemia, unspecified: Secondary | ICD-10-CM

## 2024-05-09 DIAGNOSIS — R197 Diarrhea, unspecified: Secondary | ICD-10-CM

## 2024-05-09 DIAGNOSIS — K644 Residual hemorrhoidal skin tags: Secondary | ICD-10-CM | POA: Diagnosis not present

## 2024-05-09 DIAGNOSIS — K3189 Other diseases of stomach and duodenum: Secondary | ICD-10-CM | POA: Diagnosis not present

## 2024-05-09 DIAGNOSIS — K635 Polyp of colon: Secondary | ICD-10-CM | POA: Diagnosis not present

## 2024-05-09 DIAGNOSIS — K449 Diaphragmatic hernia without obstruction or gangrene: Secondary | ICD-10-CM | POA: Diagnosis not present

## 2024-05-09 DIAGNOSIS — Z1211 Encounter for screening for malignant neoplasm of colon: Secondary | ICD-10-CM

## 2024-05-09 MED ORDER — PANTOPRAZOLE SODIUM 20 MG PO TBEC
20.0000 mg | DELAYED_RELEASE_TABLET | Freq: Every day | ORAL | 1 refills | Status: DC
Start: 2024-05-09 — End: 2024-05-31

## 2024-05-09 MED ORDER — SODIUM CHLORIDE 0.9 % IV SOLN: 500.0000 mL | Freq: Once | INTRAVENOUS | Status: DC

## 2024-05-09 NOTE — Progress Notes (Signed)
 Called to room to assist during endoscopic procedure.  Patient ID and intended procedure confirmed with present staff. Received instructions for my participation in the procedure from the performing physician.

## 2024-05-09 NOTE — Progress Notes (Signed)
 A/o x 3, VSS, gd SR's, pleased with anesthesia, report to RN

## 2024-05-09 NOTE — Op Note (Signed)
 [facility] Patient Name: Cody Knapp Procedure Date: 05/09/2024 11:03 AM MRN: [id number] Endoscopist: Aloha Finner , MD, [id number] Age: 63 Referring MD:  Date of Birth: 08-Feb-1961 Gender: Male Account #: [id number] Procedure:                Colonoscopy Indications:              High risk colon cancer surveillance: Personal                            history of sessile serrated colon polyp (less than                            10 mm in size) with no dysplasia, Incidental - Iron                            deficiency anemia, Incidental - Diarrhea Medicines:                Monitored Anesthesia Care Procedure:                Pre-Anesthesia Assessment:                           - Prior to the procedure, a History and Physical                            was performed, and patient medications and                            allergies were reviewed. The patient's tolerance of                            previous anesthesia was also reviewed. The risks                            and benefits of the procedure and the sedation                            options and risks were discussed with the patient.                            All questions were answered, and informed consent                            was obtained. Prior Anticoagulants: The patient has                            taken no anticoagulant or antiplatelet agents. ASA                            Grade Assessment: I - A normal, healthy patient.                            After reviewing the risks and benefits, the patient  was deemed in satisfactory condition to undergo the                            procedure.                           After obtaining informed consent, the colonoscope                            was passed under direct vision. Throughout the                            procedure, the patient's blood pressure, pulse, and                            oxygen saturations were  monitored continuously. The                            CF [id number] #[id number] was introduced through the anus                            and advanced to the 3 cm into the ileum. The                            colonoscopy was performed without difficulty. The                            patient tolerated the procedure. The quality of the                            bowel preparation was adequate. The terminal ileum,                            ileocecal valve, appendiceal orifice, and rectum                            were photographed. Scope In: 11:27:48 AM Scope Out: 11:47:34 AM Scope Withdrawal Time: 0 hours 15 minutes 47 seconds  Total Procedure Duration: 0 hours 19 minutes 46 seconds  Findings:                 The digital rectal exam findings include                            hemorrhoids. Pertinent negatives include no                            palpable rectal lesions.                           Four sessile polyps were found in the ascending                            colon. The polyps were 4 to 7 mm in size. These  polyps were removed with a cold snare. Resection                            and retrieval were complete.                           Normal mucosa was found in the entire colon                            otherwise. Biopsies for histology were taken with a                            cold forceps from the entire colon for evaluation                            of microscopic colitis.                           Non-bleeding non-thrombosed internal hemorrhoids                            were found during retroflexion, during perianal                            exam and during digital exam. The hemorrhoids were                            Grade II (internal hemorrhoids that prolapse but                            reduce spontaneously). Complications:            No immediate complications. Estimated Blood Loss:     Estimated blood loss was minimal. Impression:                - Hemorrhoids found on digital rectal exam.                           - Four 4 to 7 mm polyps in the ascending colon,                            removed with a cold snare. Resected and retrieved.                           - Normal mucosa in the entire examined colon                            otherwise. Biopsied.                           - Non-bleeding non-thrombosed internal hemorrhoids. Recommendation:           - The patient will be observed post-procedure,                            until all discharge criteria are  met.                           - Discharge patient to home.                           - Patient has a contact number available for                            emergencies. The signs and symptoms of potential                            delayed complications were discussed with the                            patient. Return to normal activities tomorrow.                            Written discharge instructions were provided to the                            patient.                           - High fiber diet.                           - Continue present medications.                           - Await pathology results.                           - Repeat colonoscopy in 3 - 5 years for                            surveillance based on pathology results for polyp                            surveillance.                           - Not clear from today's examination for etiology                            of IDA (though small/few gastric erosions).                            Consider role of VCE if IDA remains an issue.                           - Further workup/evaluation as per primary GI team.                           - The findings and recommendations were discussed  with the patient.                           - The findings and recommendations were discussed                            with the patient's family. Aloha Finner,  MD 05/09/2024 11:58:25 AM

## 2024-05-09 NOTE — Progress Notes (Signed)
 US  abdomen on 05/08/2024 since last office visit. Please see under imaging.

## 2024-05-09 NOTE — Op Note (Signed)
 [facility] Patient Name: Cody Knapp Procedure Date: 05/09/2024 11:12 AM MRN: [id number] Endoscopist: Aloha Finner , MD, [id number] Age: 63 Referring MD:  Date of Birth: [birth year] Gender: Male Account #: [id number] Procedure:                Upper GI endoscopy Indications:              Iron deficiency anemia, Diarrhea Medicines:                Monitored Anesthesia Care Procedure:                Pre-Anesthesia Assessment:                           - Prior to the procedure, a History and Physical                            was performed, and patient medications and                            allergies were reviewed. The patient's tolerance of                            previous anesthesia was also reviewed. The risks                            and benefits of the procedure and the sedation                            options and risks were discussed with the patient.                            All questions were answered, and informed consent                            was obtained. Prior Anticoagulants: The patient has                            taken no anticoagulant or antiplatelet agents. ASA                            Grade Assessment: I - A normal, healthy patient.                            After reviewing the risks and benefits, the patient                            was deemed in satisfactory condition to undergo the                            procedure.                           After obtaining informed consent, the endoscope was  passed under direct vision. Throughout the                            procedure, the patient's blood pressure, pulse, and                            oxygen saturations were monitored continuously. The                            Olympus Scope [id number] was introduced through the                            mouth, and advanced to the second part of duodenum.                            The upper GI  endoscopy was accomplished without                            difficulty. The patient tolerated the procedure. Scope In: Scope Out: Findings:                 No gross lesions were noted in the entire esophagus.                           The Z-line was regular and was found 40 cm from the                            incisors.                           A 1 cm hiatal hernia was present.                           A few dispersed diminutive erosions with no                            bleeding and no stigmata of recent bleeding were                            found in the entire examined stomach.                           Patchy mildly erythematous mucosa without bleeding                            was found in the entire examined stomach. Biopsies                            were taken with a cold forceps for histology and                            Helicobacter pylori testing.                           No  gross lesions were noted in the duodenal bulb,                            in the first portion of the duodenum and in the                            second portion of the duodenum. Biopsies for                            histology were taken with a cold forceps for                            evaluation of celiac disease. Complications:            No immediate complications. Estimated Blood Loss:     Estimated blood loss was minimal. Impression:               - No gross lesions in the entire esophagus. Z-line                            regular, 40 cm from the incisors.                           - 1 cm hiatal hernia.                           - A few erosions with no bleeding and no stigmata                            of recent bleeding were found dispersed in the                            stomach. Erythematous mucosa in the stomach.                            Biopsied.                           - No gross lesions in the duodenal bulb, in the                            first portion of the  duodenum and in the second                            portion of the duodenum. Biopsied. Recommendation:           - Proceed to scheduled colonoscopy.                           - Initiate Pantoprazole  20 mg daily for next                            2-months to heal erosive gastropathy.                           -  Continue present medications.                           - Await pathology results.                           - The findings and recommendations were discussed                            with the patient.                           - The findings and recommendations were discussed                            with the patient's family. Aloha Finner, MD 05/09/2024 11:53:09 AM

## 2024-05-09 NOTE — Patient Instructions (Addendum)
 Start Pantoprazole  20mg /day for two months for erosive gastropathy.   YOU HAD AN ENDOSCOPIC PROCEDURE TODAY AT THE [facility]:   Refer to the procedure report that was given to you for any specific questions about what was found during the examination.  If the procedure report does not answer your questions, please call your gastroenterologist to clarify.  If you requested that your care partner not be given the details of your procedure findings, then the procedure report has been included in a sealed envelope for you to review at your convenience later.  YOU SHOULD EXPECT: Some feelings of bloating in the abdomen. Passage of more gas than usual.  Walking can help get rid of the air that was put into your GI tract during the procedure and reduce the bloating. If you had a lower endoscopy (such as a colonoscopy or flexible sigmoidoscopy) you may notice spotting of blood in your stool or on the toilet paper. If you underwent a bowel prep for your procedure, you may not have a normal bowel movement for a few days.  Please Note:  You might notice some irritation and congestion in your nose or some drainage.  This is from the oxygen used during your procedure.  There is no need for concern and it should clear up in a day or so.  SYMPTOMS TO REPORT IMMEDIATELY:  Following lower endoscopy (colonoscopy or flexible sigmoidoscopy):  Excessive amounts of blood in the stool  Significant tenderness or worsening of abdominal pains  Swelling of the abdomen that is new, acute  Fever of 100F or higher  Following upper endoscopy (EGD)  Vomiting of blood or coffee ground material  New chest pain or pain under the shoulder blades  Painful or persistently difficult swallowing  New shortness of breath  Fever of 100F or higher  Black, tarry-looking stools  For urgent or emergent issues, a gastroenterologist can be reached at any hour by calling [phone]. Do not use MyChart messaging for  urgent concerns.    DIET:  We do recommend a small meal at first, but then you may proceed to your regular diet.  Drink plenty of fluids but you should avoid alcoholic beverages for 24 hours.  ACTIVITY:  You should plan to take it easy for the rest of today and you should NOT DRIVE or use heavy machinery until tomorrow (because of the sedation medicines used during the test).    FOLLOW UP: Our staff will call the number listed on your records the next business day following your procedure.  We will call around 7:15- 8:00 am to check on you and address any questions or concerns that you may have regarding the information given to you following your procedure. If we do not reach you, we will leave a message.     If any biopsies were taken you will be contacted by phone or by letter within the next 1-3 weeks.  Please call us  at [phone] if you have not heard about the biopsies in 3 weeks.    SIGNATURES/CONFIDENTIALITY: You and/or your care partner have signed paperwork which will be entered into your electronic medical record.  These signatures attest to the fact that that the information above on your After Visit Summary has been reviewed and is understood.  Full responsibility of the confidentiality of this discharge information lies with you and/or your care-partner.

## 2024-05-09 NOTE — Progress Notes (Signed)
 GASTROENTEROLOGY PROCEDURE H&P NOTE   Primary Care Physician: Merna Huxley, NP  HPI: Cody Knapp is a 62 y.o. male who presents for EGD/colonoscopy for evaluation of iron deficiency anemia and diarrhea rule out celiac disease.  Past Medical History:  Diagnosis Date   Allergy    Personal history of colonic adenoma 08/10/2013   Rosacea    Past Surgical History:  Procedure Laterality Date   COLONOSCOPY     KNEE ARTHROSCOPY Right    Current Outpatient Medications  Medication Sig Dispense Refill   Doxylamine Succinate, Sleep, (SLEEP AID PO) Take by mouth.     zolpidem  (AMBIEN ) 10 MG tablet TAKE 1 TABLET BY MOUTH EVERY DAY AT BEDTIME AS NEEDED FOR SLEEP 30 tablet 2   diphenoxylate -atropine  (LOMOTIL ) 2.5-0.025 MG tablet Take 1 tablet by mouth 4 (four) times daily as needed for diarrhea or loose stools. 40 tablet 1   sildenafil  (VIAGRA ) 100 MG tablet Take 1 tablet (100 mg total) by mouth daily as needed for erectile dysfunction. 10 tablet 3   triamcinolone  cream (KENALOG ) 0.1 % APPLY TO AFFECTED AREA TWICE A DAY 80 g 0   Current Facility-Administered Medications  Medication Dose Route Frequency Provider Last Rate Last Admin   0.9 %  sodium chloride  infusion  500 mL Intravenous Once Mansouraty, Veera Stapleton Jr., MD        Current Outpatient Medications:    Doxylamine Succinate, Sleep, (SLEEP AID PO), Take by mouth., Disp: , Rfl:    zolpidem  (AMBIEN ) 10 MG tablet, TAKE 1 TABLET BY MOUTH EVERY DAY AT BEDTIME AS NEEDED FOR SLEEP, Disp: 30 tablet, Rfl: 2   diphenoxylate -atropine  (LOMOTIL ) 2.5-0.025 MG tablet, Take 1 tablet by mouth 4 (four) times daily as needed for diarrhea or loose stools., Disp: 40 tablet, Rfl: 1   sildenafil  (VIAGRA ) 100 MG tablet, Take 1 tablet (100 mg total) by mouth daily as needed for erectile dysfunction., Disp: 10 tablet, Rfl: 3   triamcinolone  cream (KENALOG ) 0.1 %, APPLY TO AFFECTED AREA TWICE A DAY, Disp: 80 g, Rfl: 0  Current Facility-Administered  Medications:    0.9 %  sodium chloride  infusion, 500 mL, Intravenous, Once, Mansouraty, Aloha Raddle., MD No Known Allergies Family History  Problem Relation Age of Onset   Heart disease Father        aortic vavle disease (replaced)   Colon polyps Sister    Colon cancer Neg Hx    Pancreatic cancer Neg Hx    Esophageal cancer Neg Hx    Rectal cancer Neg Hx    Stomach cancer Neg Hx    Social History   Socioeconomic History   Marital status: Married    Spouse name: Not on file   Number of children: Not on file   Years of education: Not on file   Highest education level: Bachelor's degree (e.g., BA, AB, BS)  Occupational History   Not on file  Tobacco Use   Smoking status: Never   Smokeless tobacco: Never  Vaping Use   Vaping status: Not on file  Substance and Sexual Activity   Alcohol use: Yes    Comment: social   Drug use: Never   Sexual activity: Not on file  Other Topics Concern   Not on file  Social History Narrative   Works in Designer, fashion/clothing    Married    Three daughters ( 23, 19,16)    Social Drivers of Health   Financial Resource Strain: Low Risk  (12/15/2021)   Overall Financial Resource Strain (CARDIA)  Difficulty of Paying Living Expenses: Not hard at all  Food Insecurity: No Food Insecurity (12/15/2021)   Hunger Vital Sign    Worried About Running Out of Food in the Last Year: Never true    Ran Out of Food in the Last Year: Never true  Transportation Needs: No Transportation Needs (12/15/2021)   PRAPARE - Administrator, Civil Service (Medical): No    Lack of Transportation (Non-Medical): No  Physical Activity: Sufficiently Active (12/15/2021)   Exercise Vital Sign    Days of Exercise per Week: 4 days    Minutes of Exercise per Session: 60 min  Stress: Stress Concern Present (12/15/2021)   [employer] of Occupational Health - Occupational Stress Questionnaire    Feeling of Stress : Very much  Social Connections: Socially Integrated  (12/15/2021)   Social Connection and Isolation Panel    Frequency of Communication with Friends and Family: More than three times a week    Frequency of Social Gatherings with Friends and Family: Once a week    Attends Religious Services: 1 to 4 times per year    Active Member of [employer] or Organizations: Yes    Attends Banker Meetings: 1 to 4 times per year    Marital Status: Married  Catering manager Violence: Not on file    Physical Exam: Today's Vitals   05/09/24 1005  BP: 116/60  Pulse: (!) 56  Temp: (!) 97.2 F (36.2 C)  TempSrc: Temporal  SpO2: 100%  Weight: 170 lb (77.1 kg)  Height: 5' 10 (1.778 m)   Body mass index is 24.39 kg/m. GEN: NAD EYE: Sclerae anicteric ENT: MMM CV: Non-tachycardic GI: Soft, NT/ND NEURO:  Alert & Oriented x 3  Lab Results: No results for input(s): WBC, HGB, HCT, PLT in the last 72 hours. BMET No results for input(s): NA, K, CL, CO2, GLUCOSE, BUN, CREATININE, CALCIUM in the last 72 hours. LFT No results for input(s): PROT, ALBUMIN, AST, ALT, ALKPHOS, BILITOT, BILIDIR, IBILI in the last 72 hours. PT/INR No results for input(s): LABPROT, INR in the last 72 hours.   Impression / Plan: This is a 63 y.o.male who presents for EGD/colonoscopy for evaluation of iron deficiency anemia and diarrhea rule out celiac disease.  The risks and benefits of endoscopic evaluation/treatment were discussed with the patient and/or family; these include but are not limited to the risk of perforation, infection, bleeding, missed lesions, lack of diagnosis, severe illness requiring hospitalization, as well as anesthesia and sedation related illnesses.  The patient's history has been reviewed, patient examined, no change in status, and deemed stable for procedure.  The patient and/or family is agreeable to proceed.    Aloha Finner, MD [facility] Office #  [phone]

## 2024-05-10 ENCOUNTER — Ambulatory Visit: Payer: Self-pay | Admitting: Physician Assistant

## 2024-05-10 ENCOUNTER — Telehealth: Payer: Self-pay

## 2024-05-10 NOTE — Telephone Encounter (Signed)
 Follow up call to pt, no answer.

## 2024-05-11 ENCOUNTER — Ambulatory Visit: Payer: Self-pay | Admitting: Adult Health

## 2024-05-11 LAB — OVA AND PARASITE EXAMINATION
CONCENTRATE RESULT:: NONE SEEN
MICRO NUMBER:: 16960862
SPECIMEN QUALITY:: ADEQUATE
TRICHROME RESULT:: NONE SEEN

## 2024-05-11 LAB — SURGICAL PATHOLOGY

## 2024-05-11 NOTE — Telephone Encounter (Signed)
**Note De-identified  [place] Obfuscation** Please advise 

## 2024-05-12 ENCOUNTER — Ambulatory Visit: Payer: Self-pay | Admitting: Gastroenterology

## 2024-05-13 LAB — ENDOMYSIAL IGA ANTIBODY: Endomysial IgA: NEGATIVE

## 2024-05-13 LAB — CELIAC DISEASE AB SCREEN W/RFX
Antigliadin Abs, IgA: 41 U — ABNORMAL HIGH (ref 0–19)
IgA/Immunoglobulin A, Serum: 206 mg/dL (ref 61–437)
Transglutaminase IgA: 6 U/mL — ABNORMAL HIGH (ref 0–3)

## 2024-05-21 ENCOUNTER — Other Ambulatory Visit: Payer: Self-pay | Admitting: Pulmonary Disease

## 2024-05-22 NOTE — Telephone Encounter (Signed)
 Refill request for zolpidem .  Last OV 02/02/2022.  Please advise.  Thank you.

## 2024-05-22 NOTE — Telephone Encounter (Deleted)
 Please advise, thank you! LOV: 02/02/2022

## 2024-05-30 NOTE — Progress Notes (Unsigned)
 Cody Knapp 62 y.o. 25-Jun-1961 [id number]  Assessment & Plan:   Encounter Diagnoses  Name Primary?   Celiac disease Yes   Thrombocytopenia    Splenomegaly - borderline    Iron deficiency anemia due to celiac disease +/- some blood donation    B12 deficiency (low normal)    History of colonic polyps     I think the balance of evidence indicates he has celiac disease B12 and iron deficiency.  I do not think blood donation is significant enough over time to explain the iron deficiency alone.  The pathologist thought it might be chronic duodenitis versus celiac but again I think the balance of evidence points to having that.  Will check HLA studies for celiac. Dietitian referral for celiac diet, gluten-free Given borderline splenomegaly and thrombocytopenia will refer for hematology consultation.  He has also had leukopenia at times  Continue B12 and iron supplementation  Discontinue pantoprazole  for his chronic inactive gastritis.  Anticipate repeat celiac labs in 6 months after being gluten-free.  That should be April 2026.     Subjective:   Chief Complaint: Celiac disease suspected  HPI  63 year old man seen by Ellouise Console, PA-C upon request from primary care due to diarrhea, in mid September.  The GI pathogen panel had been negative.  Diarrhea onset was acute but there were persistent symptoms.  He had celiac screening done and IgA based TTG antibody was slightly elevated at 6, gliadin antibodies IgA were elevated at 21 and the mesial antibody was negative.  Because of these abnormalities and iron deficiency anemia he was recommended to have procedures and an EGD and a colonoscopy were performed and results are summarized below.  The patient has iron deficiency anemia and has been a blood donor lately also.  He has also had a low normal B12 level.    EGD 05/09/2024 - No gross lesions in the entire esophagus. Z-line regular, 40 cm from the incisors. - 1 cm hiatal  hernia. - A few erosions with no bleeding and no stigmata of recent bleeding were found dispersed in the stomach. Erythematous mucosa in the stomach. Biopsied. - No gross lesions in the duodenal bulb, in the first portion of the duodenum and in the second portion of the duodenum. Biopsied.  Colonoscopy 05/09/2024 4 polyps maximum 7 mm, hemorrhoids otherwise normal.  Random biopsies taken.  FINAL DIAGNOSIS       1. Surgical [P], gastric :      - MILD CHRONIC INACTIVE GASTRITIS.      - NO EVIDENCE OF H. PYLORI ON H&E STAIN.       2. Surgical [P], duodenum :      - DUODENAL MUCOSA WITH INCREASED INTRAEPITHELIAL LYMPHOCYTES AND MILD TO      MODERATE VILLOUS BLUNTING (SEE COMMENT).       3. Surgical [P], colon nos, random sites :      - BENIGN COLONIC MUCOSA WITH NO SPECIFIC PATHOLOGIC CHANGE.       4. Surgical [P], colon, ascending, polyp (4) :      - FRAGMENTS OF SESSILE SERRATED POLYP WITHOUT CYTOLOGIC DYSPLASIA.       Diagnosis Note : Sections of the duodenum demonstrate small bowel mucosa with      increased intraepithelial lymphocytes, mild to moderate villous blunting,      expanded lamina propria, and scattered neutrophilic inflammation.The      differential diagnosis includes celiac disease but given the other inflammatory      features, a  diagnosis of chronic duodenitis is favored.Clinical and serologic      correlation is recommended.      Since the biopsies returned he has been trying to be gluten-free.  The diarrhea abated before he started gluten-free diet.  Not having any GI symptoms at this time.  He does describe some symptoms of borborygmi off and on in the past but he has not had altered bowel habits or diarrhea problems to speak of over time.  He has been donating blood but maybe some just in the last 3 years so he is not donated chronically for many years.  Long history of thrombocytopenia.  When he was seen by Ellouise she ordered an ultrasound to evaluate that.  He was  started on pantoprazole  20 mg daily given the gastritis diagnosis.  He did not have any gastritis symptoms.  He is somewhat puzzled by the diagnosis of celiac disease occurring at age 53.  He does not have any arthralgia complaints he has not had dermatitis herpetiformis.  Also no chronic gastrointestinal complaints.  It does sound like he has been  Complete abdominal ultrasound 05/13/2024 IMPRESSION: 1. Top-normal/mildly increased splenic volume for adult male. 2. Very mildly increased parenchymal echogenicity of the liver may be within normal limits. Slight degree of steatosis cannot be excluded by ultrasound.   04/28/2024 GI pathogen panel PCR: Negative for all pathogens.   04/27/2024: Labs showed iron deficiency anemia with Hgb 11.2, MCV 81, low iron 27, iron saturation 6.7, ferritin 17.  Thrombocytopenia with platelet 148.  He has history of chronic thrombocytopenia for 20 years.  Leukopenia for 20 years.   07/2018 colonoscopy by Dr. Avram: Good prep.  3 diminutive sessile hyperplastic polyps removed.  7-year repeat colonoscopy recommended (due 07/2025).   07/2013 colonoscopy: Sessile serrated colon polyp.    No Known Allergies Current Meds  Medication Sig   Cyanocobalamin  (VITAMIN B-12 PO) Take 2,500 mcg by mouth daily.   Doxylamine Succinate, Sleep, (SLEEP AID PO) Take by mouth.   ferrous sulfate 325 (65 FE) MG tablet Take 325 mg by mouth daily.   sildenafil  (VIAGRA ) 100 MG tablet Take 1 tablet (100 mg total) by mouth daily as needed for erectile dysfunction.   triamcinolone  cream (KENALOG ) 0.1 % APPLY TO AFFECTED AREA TWICE A DAY   zolpidem  (AMBIEN ) 10 MG tablet TAKE 1 TABLET BY MOUTH AT BEDTIME AS NEEDED FOR SLEEP   [DISCONTINUED] pantoprazole  (PROTONIX ) 20 MG tablet Take 1 tablet (20 mg total) by mouth daily.   Past Medical History:  Diagnosis Date   Allergy    Celiac disease 05/31/2024   Iron deficiency anemia due to celiac disease +/- some blood donation 05/31/2024    Personal history of colonic adenoma 08/10/2013   Rosacea    Thrombocytopenia 06/18/2010   Annotation: chronic--unchanged  Qualifier: Diagnosis of   By: Tammie MD, Bruce         Past Surgical History:  Procedure Laterality Date   COLONOSCOPY     ESOPHAGOGASTRODUODENOSCOPY     KNEE ARTHROSCOPY Right    Social History   Social History Narrative   Works in Designer, fashion/clothing    Married    Three daughters ( 23, 19,16)    family history includes Colon polyps in his sister; Heart disease in his father.   Review of Systems See HPI  Objective:   Physical Exam BP 106/70   Pulse 79   Ht 5' 10 (1.778 m)   Wt 170 lb 6 oz (77.3 kg)   BMI  24.45 kg/m  NAD  Data reviewed see HPI

## 2024-05-31 ENCOUNTER — Encounter: Payer: Self-pay | Admitting: Internal Medicine

## 2024-05-31 ENCOUNTER — Ambulatory Visit (INDEPENDENT_AMBULATORY_CARE_PROVIDER_SITE_OTHER): Admitting: Internal Medicine

## 2024-05-31 ENCOUNTER — Other Ambulatory Visit (INDEPENDENT_AMBULATORY_CARE_PROVIDER_SITE_OTHER)

## 2024-05-31 VITALS — BP 106/70 | HR 79 | Ht 70.0 in | Wt 170.4 lb

## 2024-05-31 DIAGNOSIS — D696 Thrombocytopenia, unspecified: Secondary | ICD-10-CM

## 2024-05-31 DIAGNOSIS — K900 Celiac disease: Secondary | ICD-10-CM

## 2024-05-31 DIAGNOSIS — E538 Deficiency of other specified B group vitamins: Secondary | ICD-10-CM

## 2024-05-31 DIAGNOSIS — R161 Splenomegaly, not elsewhere classified: Secondary | ICD-10-CM | POA: Insufficient documentation

## 2024-05-31 DIAGNOSIS — Z860100 Personal history of colon polyps, unspecified: Secondary | ICD-10-CM

## 2024-05-31 DIAGNOSIS — D509 Iron deficiency anemia, unspecified: Secondary | ICD-10-CM | POA: Diagnosis not present

## 2024-05-31 NOTE — Patient Instructions (Addendum)
 Your provider has requested that you go to the basement level for lab work before leaving today. Press B on the elevator. The lab is located at the first door on the left as you exit the elevator.  Due to recent changes in healthcare laws, you may see the results of your imaging and laboratory studies on MyChart before your provider has had a chance to review them.  We understand that in some cases there may be results that are confusing or concerning to you. Not all laboratory results come back in the same time frame and the provider may be waiting for multiple results in order to interpret others.  Please give us  48 hours in order for your provider to thoroughly review all the results before contacting the office for clarification of your results.   Stop the lomotil  and pantoprazole .  We have placed a referral to [facility]  Hematology and they will contact you about an appointment.   We have placed a referral to [facility] and they will contact you about an appointment.   I appreciate the opportunity to care for you. Lupita Commander, MD, [facility]

## 2024-06-02 ENCOUNTER — Telehealth: Payer: Self-pay | Admitting: Oncology

## 2024-06-05 ENCOUNTER — Ambulatory Visit: Payer: Self-pay | Admitting: Internal Medicine

## 2024-06-05 LAB — SPECIMEN STATUS REPORT

## 2024-06-08 ENCOUNTER — Telehealth: Payer: Self-pay | Admitting: *Deleted

## 2024-06-08 ENCOUNTER — Ambulatory Visit: Payer: Self-pay

## 2024-06-08 NOTE — Telephone Encounter (Signed)
 Copied from CRM #[id number]. Topic: Clinical - Medication Question >> Jun 08, 2024  9:40 AM Carlyon D wrote: Reason for CRM: Pt is calling in regards to medication that he was prescribed a pill for his itchy back. Pt can not remember the name of the medication and would like some one to reach out to him in regards to this,

## 2024-06-08 NOTE — Telephone Encounter (Signed)
 Pt is wanting a refill on the the lexapro  from 2022 for his itching. Please advise if pt needs an appt.

## 2024-06-08 NOTE — Telephone Encounter (Signed)
 Patient reports nocturnal itching to his back. Patient states this has happened before and said he was treated with escitalopram  (LEXAPRO ) 5MG  Daily. Patient states the itching is only at night and started back up in the last couple of weeks. Patient reports he is going out of town tomorrow and is asking for a prescription to be sent to [facility] on file. Patient states if medication won't be sent until tomorrow for the medication to be sent to the [facility] in [facility] that is listed in this note. Patient is asking for a follow up call.  FYI Only or Action Required?: Action required by provider: clinical question for provider.  Patient was last seen in primary care on 05/04/2024 by Merna Huxley, NP.  Called Nurse Triage reporting Pruritis.  Symptoms began several weeks ago.  Interventions attempted: Rest, hydration, or home remedies.  Symptoms are: unchanged.  Triage Disposition: See PCP When Office is Open (Within 3 Days)  Patient/caregiver understands and will follow disposition?: Yes  Copied from CRM #[id number]. Topic: Clinical - Medication Question >> Jun 08, 2024  9:40 AM Carlyon D wrote: Reason for CRM: Pt is calling in regards to medication that he was prescribed a pill for his itchy back. Pt can not remember the name of the medication and would like some one to reach out to him in regards to this, >> Jun 08, 2024  9:53 AM Drema MATSU wrote: Patient called back and stated that medication is escitalopram  (LEXAPRO ) 5 MG tablet . He wants it sent to [facility] [place], [place] [address] [phone]. Please contact patient once filled or if it can't be filled. Reason for Disposition  [1] MODERATE-SEVERE local itching (i.e., interferes with work, school, activities) AND [2] not improved after 24 hours of hydrocortisone cream  Answer Assessment - Initial Assessment Questions 1. DESCRIPTION: Describe the itching you are having. Where is it located?     Patient  reports that patient is having itching to his back.  2. SEVERITY: How bad is it?      Moderate to severe 3. SCRATCHING: Are there any scratch marks? Bleeding?     no 4. ONSET: When did the itching begin? (e.g., minutes, hours, days ago)     Started a couple of weeks ago 5. CAUSE: What do you think is causing the itching?      unsure 6. OTHER SYMPTOMS: Do you have any other symptoms? (e.g., fever, rash)     no  Protocols used: Itching - Localized-A-AH

## 2024-06-09 NOTE — Telephone Encounter (Signed)
 Patient notified of update  and verbalized understanding. Pt declined appt. Pt will call back next week if sx worsens.

## 2024-06-12 LAB — CELIAC DISEASE HLA DQ ASSOC.
DQ2 (DQA1 0501/0505,DQB1 02XX): NEGATIVE
DQ8 (DQA1 03XX,DQB1 0302): POSITIVE

## 2024-06-12 LAB — SPECIMEN STATUS REPORT

## 2024-06-14 ENCOUNTER — Other Ambulatory Visit: Payer: Self-pay | Admitting: Internal Medicine

## 2024-06-14 DIAGNOSIS — K900 Celiac disease: Secondary | ICD-10-CM

## 2024-06-30 ENCOUNTER — Inpatient Hospital Stay: Attending: Oncology | Admitting: Oncology

## 2024-06-30 ENCOUNTER — Encounter: Payer: Self-pay | Admitting: Oncology

## 2024-06-30 ENCOUNTER — Inpatient Hospital Stay

## 2024-06-30 VITALS — BP 114/74 | HR 79 | Temp 97.2°F | Resp 20 | Ht 70.0 in | Wt 173.4 lb

## 2024-06-30 DIAGNOSIS — D696 Thrombocytopenia, unspecified: Secondary | ICD-10-CM | POA: Insufficient documentation

## 2024-06-30 DIAGNOSIS — K900 Celiac disease: Secondary | ICD-10-CM | POA: Diagnosis not present

## 2024-06-30 DIAGNOSIS — Z79899 Other long term (current) drug therapy: Secondary | ICD-10-CM | POA: Insufficient documentation

## 2024-06-30 DIAGNOSIS — Z79620 Long term (current) use of immunosuppressive biologic: Secondary | ICD-10-CM | POA: Insufficient documentation

## 2024-06-30 DIAGNOSIS — Z83719 Family history of colon polyps, unspecified: Secondary | ICD-10-CM | POA: Insufficient documentation

## 2024-06-30 DIAGNOSIS — E538 Deficiency of other specified B group vitamins: Secondary | ICD-10-CM | POA: Diagnosis not present

## 2024-06-30 DIAGNOSIS — D509 Iron deficiency anemia, unspecified: Secondary | ICD-10-CM | POA: Insufficient documentation

## 2024-06-30 LAB — CBC WITH DIFFERENTIAL (CANCER CENTER ONLY)
Abs Immature Granulocytes: 0 K/uL (ref 0.00–0.07)
Basophils Absolute: 0 K/uL (ref 0.0–0.1)
Basophils Relative: 1 %
Eosinophils Absolute: 0.3 K/uL (ref 0.0–0.5)
Eosinophils Relative: 8 %
HCT: 41.7 % (ref 39.0–52.0)
Hemoglobin: 13.4 g/dL (ref 13.0–17.0)
Immature Granulocytes: 0 %
Lymphocytes Relative: 22 %
Lymphs Abs: 0.8 K/uL (ref 0.7–4.0)
MCH: 28 pg (ref 26.0–34.0)
MCHC: 32.1 g/dL (ref 30.0–36.0)
MCV: 87.1 fL (ref 80.0–100.0)
Monocytes Absolute: 0.4 K/uL (ref 0.1–1.0)
Monocytes Relative: 11 %
Neutro Abs: 2.2 K/uL (ref 1.7–7.7)
Neutrophils Relative %: 58 %
Platelet Count: 145 K/uL — ABNORMAL LOW (ref 150–400)
RBC: 4.79 MIL/uL (ref 4.22–5.81)
RDW: 17.1 % — ABNORMAL HIGH (ref 11.5–15.5)
WBC Count: 3.8 K/uL — ABNORMAL LOW (ref 4.0–10.5)
nRBC: 0 % (ref 0.0–0.2)

## 2024-06-30 LAB — CMP (CANCER CENTER ONLY)
ALT: 12 U/L (ref 0–44)
AST: 20 U/L (ref 15–41)
Albumin: 4.3 g/dL (ref 3.5–5.0)
Alkaline Phosphatase: 60 U/L (ref 38–126)
Anion gap: 3 — ABNORMAL LOW (ref 5–15)
BUN: 19 mg/dL (ref 8–23)
CO2: 32 mmol/L (ref 22–32)
Calcium: 9.2 mg/dL (ref 8.9–10.3)
Chloride: 104 mmol/L (ref 98–111)
Creatinine: 1.09 mg/dL (ref 0.61–1.24)
GFR, Estimated: >60 mL/min (ref >60)
Glucose, Bld: 75 mg/dL (ref 70–99)
Potassium: 4.4 mmol/L (ref 3.5–5.1)
Sodium: 139 mmol/L (ref 135–145)
Total Bilirubin: 0.4 mg/dL (ref 0.0–1.2)
Total Protein: 6.7 g/dL (ref 6.5–8.1)

## 2024-06-30 LAB — IRON AND IRON BINDING CAPACITY (CC-WL,HP ONLY)
Iron: 216 ug/dL — ABNORMAL HIGH (ref 45–182)
Saturation Ratios: 53 % — ABNORMAL HIGH (ref 17.9–39.5)
TIBC: 409 ug/dL (ref 250–450)
UIBC: 193 ug/dL (ref 117–376)

## 2024-06-30 LAB — FERRITIN: Ferritin: 26 ng/mL (ref 24–336)

## 2024-06-30 LAB — VITAMIN B12: Vitamin B-12: 1224 pg/mL — ABNORMAL HIGH (ref 180–914)

## 2024-06-30 LAB — FOLATE: Folate: 5.9 ng/mL — ABNORMAL LOW (ref >5.9)

## 2024-07-01 LAB — TSH: TSH: 2.39 u[IU]/mL (ref 0.350–4.500)

## 2024-07-07 ENCOUNTER — Encounter: Payer: Self-pay | Admitting: Oncology

## 2024-07-07 ENCOUNTER — Inpatient Hospital Stay: Admitting: Oncology

## 2024-07-07 DIAGNOSIS — D696 Thrombocytopenia, unspecified: Secondary | ICD-10-CM | POA: Diagnosis not present

## 2024-07-07 DIAGNOSIS — E538 Deficiency of other specified B group vitamins: Secondary | ICD-10-CM

## 2024-07-07 DIAGNOSIS — D509 Iron deficiency anemia, unspecified: Secondary | ICD-10-CM | POA: Diagnosis not present

## 2024-07-07 DIAGNOSIS — K900 Celiac disease: Secondary | ICD-10-CM

## 2024-07-07 NOTE — Assessment & Plan Note (Addendum)
 Likely secondary to frequent blood donations and also from celiac disease. Hemoglobin was slightly low at 11.5 g/dL in September 7974.   Iron supplementation has been inconsistent due to gastrointestinal side effects, but adherence has improved in the last 6-8 weeks. Iron deficiency may contribute to low platelet count.  On his consultation with us  on 06/30/2024, labs actually showed normal hemoglobin of 13.4, MCV 81.1.  Iron studies currently show no evidence of iron deficiency.  Ferritin on the lower side at 26.  - Continue daily iron supplementation - Consider IV iron infusion if oral supplementation is insufficient - Take iron with vitamin C or orange juice to enhance absorption

## 2024-07-07 NOTE — Assessment & Plan Note (Signed)
 Likely secondary to frequent blood donations and also from celiac disease. Hemoglobin was slightly low at 11.5 g/dL in September 7974.   Iron supplementation has been inconsistent due to gastrointestinal side effects, but adherence has improved in the last 6-8 weeks. Iron deficiency may contribute to low platelet count.  Labs today actually showed normal hemoglobin of 13.4, MCV 81.1.  Iron studies currently show no evidence of iron deficiency.  Ferritin on the lower side at 26.  - Continue daily iron supplementation - Consider IV iron infusion if oral supplementation is insufficient - Take iron with vitamin C or orange juice to enhance absorption

## 2024-07-07 NOTE — Progress Notes (Signed)
 [facility]  HEMATOLOGY-ONCOLOGY ELECTRONIC VISIT PROGRESS NOTE  PATIENT NAME: Cody Knapp   MR#: [id number] DOB: 11/06/1960  DATE OF SERVICE: 07/07/2024  Patient Care Team: Merna Huxley, NP as PCP - General (Family Medicine) Avram Lupita BRAVO, MD as Consulting Physician (Gastroenterology)  I connected with the patient via telephone conference and verified that I am speaking with the correct person using two identifiers. The patient's location is at home and I am providing care from the [facility].  I discussed the limitations, risks, security and privacy concerns of performing an evaluation and management service by e-visits and the availability of in person appointments. I also discussed with the patient that there may be a patient responsible charge related to this service. The patient expressed understanding and agreed to proceed.   ASSESSMENT & PLAN:   Cody Knapp is a 63 y.o. gentleman with a past medical history of rosacea, suspected celiac disease, was referred to our service for evaluation of thrombocytopenia.   Thrombocytopenia Fluctuating platelet counts with mild thrombocytopenia.  Most recent platelet count was 148,000 on 04/27/2024.  On review of records, he has had chronic thrombocytopenia dating back to at least December 2007, with platelet count as low as 102,000 in September 2010.  On his consultation with us  on 06/30/2024, labs showed platelet count of 145,000.  Overall stable.  Hemoglobin was low normal at 13.4. Iron studies, B12, TSH were within normal limits. Folic acid was decreased at 5.9.   Ultrasound abdomen in September 2025 showed top normal/mildly increased splenic volume with spleen size measuring 11.9 x 11.6 x 5.5 cm.  Practically normal-sized.    Mild folic acid deficiency could be contributing to thrombocytopenia.  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  No intervention would be needed unless platelet count is consistently  below 30,000.   He was started on folic acid supplementation from 07/07/2024.  He was advised to continue B12 supplementation and oral iron supplementation along with vitamin C to enhance absorption.  Folate deficiency Folic acid level slightly below normal at 5.9. Low folic acid can cause cytopenias. No previous folic acid levels in the system. Discussed dietary sources of folic acid and the option of B complex supplementation. - Start B complex supplementation with at least 0.5 mg of folic acid - Will check folic acid levels at next appointment  Iron deficiency anemia due to celiac disease +/- some blood donation Likely secondary to frequent blood donations and also from celiac disease. Hemoglobin was slightly low at 11.5 g/dL in September 7974.   Iron supplementation has been inconsistent due to gastrointestinal side effects, but adherence has improved in the last 6-8 weeks. Iron deficiency may contribute to low platelet count.  On his consultation with us  on 06/30/2024, labs actually showed normal hemoglobin of 13.4, MCV 81.1.  Iron studies currently show no evidence of iron deficiency.  Ferritin on the lower side at 26.  - Continue daily iron supplementation - Consider IV iron infusion if oral supplementation is insufficient - Take iron with vitamin C or orange juice to enhance absorption   I discussed the assessment and treatment plan with the patient. The patient was provided an opportunity to ask questions and all were answered. The patient agreed with the plan and demonstrated an understanding of the instructions. The patient was advised to call back or seek an in-person evaluation if the symptoms worsen or if the condition fails to improve as anticipated.    I spent 12 minutes  over the phone with the patient reviewing test results, discuss management and coordination/planning of care.  Cody Patten, MD 07/07/2024 12:47 PM [facility] - A  [facility] [address] [place] [address] Dept: [phone] Dept Fax: [phone]   INTERVAL HISTORY:  Please see above for problem oriented charting.  The purpose of today's discussion is to explain recent lab results and to formulate plan of care.  Discussed the use of AI scribe software for clinical note transcription with the patient, who gave verbal consent to proceed.  History of Present Illness Cody Knapp is a 63 year old male who presents for follow-up of fluctuating blood counts.  He has a history of fluctuating blood counts, including platelets and white blood cells. Recent laboratory workup showed a platelet count of 145,000, which is close to normal. His hemoglobin level has improved to 13.4 from 11.5 a few months ago. His white blood cell count remains on the lower side, recorded at 3,800, with previous counts as low as 2,900 in January and 2,700 in 2012.  His ferritin was on the lower side of normal at 26. His folic acid level was 5.9. He does not take multivitamins but has been taking 500 mg of vitamin C, iron, and B12 supplements since his last visit. His B12 level was on the higher side, likely due to supplementation.  He follows a celiac diet and is scheduled to see a dietitian to ensure a balanced diet while avoiding gluten. No symptoms of blood loss and he feels well overall.    SUMMARY OF HEMATOLOGY HISTORY:  He was referred by Dr. Avram for evaluation of mild thrombocytopenia and intermittent anemia.   He experienced a sudden onset of diarrhea starting on September 1st, lasting for two weeks. There were no prior issues with gluten, bloating, pain, nausea, headaches, fever, or cramping. No blood in stools, though dark stools are noted due to iron supplementation.   He has undergone extensive workup including endoscopy, colonoscopy, and abdominal ultrasound. The ultrasound showed a spleen size of 11.9 cm. His  platelet counts have fluctuated over the years, with a history of lower counts in 2007 and 2010. His most recent platelet count was 148,000.   On review of records, he has had chronic thrombocytopenia dating back to at least December 2007, with platelet count as low as 102,000 in September 2010.   He has a history of low iron levels and has donated blood in the past. He has been taking iron supplements more consistently over the past six to eight weeks. His hemoglobin levels have been low, leading to occasional rejection from blood donation due to low hemoglobin.   He has been evaluated for celiac disease, with a positive IgA anti-gliadin antibody test and mild to moderate villous blunting observed on endoscopy. He has been adhering to a gluten-free diet. He has a long-standing history of non-painful stomach rumbling, which has decreased in frequency since starting the diet.   No history of acid reflux or steroid use. He consumes alcohol occasionally and socially.  Fluctuating platelet counts with mild thrombocytopenia.  Most recent platelet count was 148,000 on 04/27/2024.   On review of records, he has had chronic thrombocytopenia dating back to at least December 2007, with platelet count as low as 102,000 in September 2010.   On his consultation with us  on 06/30/2024, labs showed platelet count of 145,000.  Overall stable.  Hemoglobin was  low normal at 13.4. Iron studies, B12, TSH were within normal limits. Folic acid was decreased at 5.9.   Ultrasound abdomen in September 2025 showed top normal/mildly increased splenic volume with spleen size measuring 11.9 x 11.6 x 5.5 cm.  Practically normal-sized.    Mild folic acid deficiency could be contributing to thrombocytopenia.  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  No intervention would be needed unless platelet count is consistently below 30,000.   He was started on folic acid supplementation from 07/07/2024.  He was advised to continue B12  supplementation and oral iron supplementation along with vitamin C to enhance absorption.   REVIEW OF SYSTEMS:    Review of Systems - Oncology  All other pertinent systems were reviewed with the patient and are negative.  I have reviewed the past medical history, past surgical history, social history and family history with the patient and they are unchanged from previous note.  ALLERGIES:  He has no known allergies.  MEDICATIONS:  Current Outpatient Medications  Medication Sig Dispense Refill   Betamethasone  Valerate 0.12 % foam Apply 1 Application topically 2 (two) times daily.     Cyanocobalamin  (VITAMIN B-12 PO) Take 2,500 mcg by mouth daily.     Doxylamine Succinate, Sleep, (SLEEP AID PO) Take by mouth.     ferrous sulfate 325 (65 FE) MG tablet Take 325 mg by mouth daily.     sildenafil  (VIAGRA ) 100 MG tablet Take 1 tablet (100 mg total) by mouth daily as needed for erectile dysfunction. 10 tablet 3   triamcinolone  cream (KENALOG ) 0.1 % APPLY TO AFFECTED AREA TWICE A DAY 80 g 0   zolpidem  (AMBIEN ) 10 MG tablet TAKE 1 TABLET BY MOUTH AT BEDTIME AS NEEDED FOR SLEEP 30 tablet 2   No current facility-administered medications for this visit.    PHYSICAL EXAMINATION:  Not performed today as it was a phone only visit  LABORATORY DATA:   I have reviewed the data as listed.  Recent Results (from the past 2160 hours)  Ferritin     Status: Abnormal   Collection Time: 04/27/24  3:42 PM  Result Value Ref Range   Ferritin 17.0 (L) 22.0 - 322.0 ng/mL  IBC panel     Status: Abnormal   Collection Time: 04/27/24  3:42 PM  Result Value Ref Range   Iron 27 (L) 42 - 165 ug/dL   Transferrin 710.9 787.9 - 360.0 mg/dL   Saturation Ratios 6.7 (L) 20.0 - 50.0 %   TIBC 404.6 250.0 - 450.0 mcg/dL  Basic metabolic panel with GFR     Status: None   Collection Time: 04/27/24  3:42 PM  Result Value Ref Range   Sodium 138 135 - 145 mEq/L   Potassium 4.2 3.5 - 5.1 mEq/L   Chloride 102 96 - 112  mEq/L   CO2 30 19 - 32 mEq/L   Glucose, Bld 89 70 - 99 mg/dL   BUN 16 6 - 23 mg/dL   Creatinine, Ser 8.86 0.40 - 1.50 mg/dL   GFR 29.94 >39.99 mL/min    Comment: Calculated using the CKD-EPI Creatinine Equation (2021)   Calcium 8.7 8.4 - 10.5 mg/dL  Urinalysis, Routine w reflex microscopic     Status: Abnormal   Collection Time: 04/27/24  3:42 PM  Result Value Ref Range   Color, Urine YELLOW Yellow;Lt. Yellow;Straw;Dark Yellow;Amber;Green;Red;Brown   APPearance CLEAR Clear;Turbid;Slightly Cloudy;Cloudy   Specific Gravity, Urine 1.015 1.000 - 1.030   pH 6.0 5.0 - 8.0  Total Protein, Urine NEGATIVE Negative   Urine Glucose NEGATIVE Negative   Ketones, ur NEGATIVE Negative   Bilirubin Urine NEGATIVE Negative   Hgb urine dipstick NEGATIVE Negative   Urobilinogen, UA 0.2 0.0 - 1.0   Leukocytes,Ua NEGATIVE Negative   Nitrite NEGATIVE Negative   WBC, UA 0-2/hpf 0-2/hpf   RBC / HPF 0-2/hpf 0-2/hpf   Renal Epithel, UA Rare(0-4/hpf) (A) None   Ca Oxalate Crys, UA Presence of (A) None  CBC with Differential/Platelet     Status: Abnormal   Collection Time: 04/27/24  3:42 PM  Result Value Ref Range   WBC 5.1 4.0 - 10.5 K/uL   RBC 4.28 4.22 - 5.81 Mil/uL   Hemoglobin 11.2 (L) 13.0 - 17.0 g/dL   HCT 65.2 (L) 60.9 - 47.9 %   MCV 81.1 78.0 - 100.0 fl   MCHC 32.2 30.0 - 36.0 g/dL   RDW 82.2 (H) 88.4 - 84.4 %   Platelets 148.0 (L) 150.0 - 400.0 K/uL   Neutrophils Relative % 45.4 43.0 - 77.0 %   Lymphocytes Relative 20.1 12.0 - 46.0 %   Monocytes Relative 7.9 3.0 - 12.0 %   Eosinophils Relative 26.3 Repeated and verified X2. (H) 0.0 - 5.0 %    Comment: A Manual Differential was performed and is consistent with the Automated Differential.   Basophils Relative 0.3 0.0 - 3.0 %   Neutro Abs 2.3 1.4 - 7.7 K/uL   Lymphs Abs 1.0 0.7 - 4.0 K/uL   Monocytes Absolute 0.4 0.1 - 1.0 K/uL   Eosinophils Absolute 1.3 (H) 0.0 - 0.7 K/uL   Basophils Absolute 0.0 0.0 - 0.1 K/uL  Hepatic function panel      Status: None   Collection Time: 04/27/24  3:42 PM  Result Value Ref Range   Total Bilirubin 0.4 0.2 - 1.2 mg/dL   Bilirubin, Direct 0.1 0.0 - 0.3 mg/dL   Alkaline Phosphatase 75 39 - 117 U/L   AST 19 0 - 37 U/L   ALT 13 0 - 53 U/L   Total Protein 6.3 6.0 - 8.3 g/dL   Albumin 4.0 3.5 - 5.2 g/dL  POC RNCPI-80     Status: Normal   Collection Time: 04/27/24  4:14 PM  Result Value Ref Range   SARS Coronavirus 2 Ag Negative Negative  GI Profile, Stool, PCR     Status: None   Collection Time: 04/28/24  9:06 AM  Result Value Ref Range   Campylobacter Not Detected Not Detected   C difficile toxin A/B Not Detected Not Detected   Plesiomonas shigelloides Not Detected Not Detected   Salmonella Not Detected Not Detected   Vibrio Not Detected Not Detected   Vibrio cholerae Not Detected Not Detected   Yersinia enterocolitica Not Detected Not Detected   Enteroaggregative E coli Not Detected Not Detected   Enteropathogenic E coli Not Detected Not Detected   Enterotoxigenic E coli Not Detected Not Detected   Shiga-toxin-producing E coli Not Detected Not Detected   E coli O157 Not applicable Not Detected   Shigella/Enteroinvasive E coli Not Detected Not Detected   Cryptosporidium Not Detected Not Detected   Cyclospora cayetanensis Not Detected Not Detected   Entamoeba histolytica Not Detected Not Detected   Giardia lamblia Not Detected Not Detected   Adenovirus F 40/41 Not Detected Not Detected   Astrovirus Not Detected Not Detected   Norovirus GI/GII Not Detected Not Detected   Rotavirus A Not Detected Not Detected   Sapovirus Not Detected Not Detected  Ova and Parasite Exam     Status: None   Collection Time: 05/05/24 10:25 AM   Specimen: Stool  Result Value Ref Range   MICRO NUMBER: [id number]    SPECIMEN QUALITY: Adequate    Source STOOL    STATUS: FINAL    CONCENTRATE RESULT: No ova or parasites seen    TRICHROME RESULT: No ova or parasites seen    COMMENT:      Routine Ova and  Parasite exam may not detect some parasites that occasionally cause diarrheal illness. Cryptosporidium Antigen and/or Cyclospora and Isospora Exam may be ordered to detect these parasites. One negative sample does not necessarily rule out  the presence of a parasitic infection.  For additional information, please refer to https://education.quest[employer].com/faq/FAQ203 (This link is being provided for informational/ educational purposes only.)   CBC with Differential/Platelet     Status: Abnormal   Collection Time: 05/05/24 11:39 AM  Result Value Ref Range   WBC 6.0 4.0 - 10.5 K/uL   RBC 4.41 4.22 - 5.81 Mil/uL   Hemoglobin 11.5 (L) 13.0 - 17.0 g/dL   HCT 64.3 (L) 60.9 - 47.9 %   MCV 80.6 78.0 - 100.0 fl   MCHC 32.2 30.0 - 36.0 g/dL   RDW 82.3 (H) 88.4 - 84.4 %   Platelets 147.0 (L) 150.0 - 400.0 K/uL   Neutrophils Relative % 35.2 (L) 43.0 - 77.0 %   Lymphocytes Relative 17.8 12.0 - 46.0 %   Monocytes Relative 8.5 3.0 - 12.0 %   Eosinophils Relative 37.7 Repeated and verified X2. (H) 0.0 - 5.0 %    Comment: Manual smear review agrees with instrument differential.   Basophils Relative 0.8 0.0 - 3.0 %   Neutro Abs 2.1 1.4 - 7.7 K/uL   Lymphs Abs 1.1 0.7 - 4.0 K/uL   Monocytes Absolute 0.5 0.1 - 1.0 K/uL   Eosinophils Absolute 2.3 (H) 0.0 - 0.7 K/uL   Basophils Absolute 0.0 0.0 - 0.1 K/uL  Celiac Disease Cody Screen w/Rfx     Status: Abnormal   Collection Time: 05/05/24 11:39 AM  Result Value Ref Range   Deamidated Gliadin Abs, IgA 41 (H) 0 - 19 units    Comment:                    Negative                   0 - 19                    Weak Positive             20 - 30                    Moderate to Strong Positive   >30    t-Transglutaminase (tTG) IgA 6 (H) 0 - 3 U/mL    Comment:                               Negative        0 -  3                               Weak Positive   4 - 10  Positive           >10  Tissue Transglutaminase (tTG) has been  identified  as the endomysial antigen.  Studies have demonstr-  ated that endomysial IgA antibodies have over 99%  specificity for gluten sensitive enteropathy.    Immunoglobulin A, (IgA) QN, Serum 206 61 - 437 mg/dL  Endomysial IgA Antibody     Status: None   Collection Time: 05/05/24 11:39 AM  Result Value Ref Range   Endomysial IgA Negative Negative  Surgical pathology ([facility])     Status: None   Collection Time: 05/09/24 12:00 AM  Result Value Ref Range   SURGICAL PATHOLOGY      SURGICAL PATHOLOGY [facility] [address], [address] [place], [place] [address] Telephone [phone] or [phone] Fax [phone]  REPORT OF SURGICAL PATHOLOGY   Accession #: [id number] Patient Name: Cody Knapp, Cody Knapp Visit # : [id number]  MRN: [id number] Physician: Wilhelmenia Roers DOB/Age [birth year] (Age: 48) Gender: M Collected Date: 05/09/2024 Received Date: 05/10/2024  FINAL DIAGNOSIS       1. Surgical [P], gastric :       - MILD CHRONIC INACTIVE GASTRITIS.      - NO EVIDENCE OF H. PYLORI ON H&E STAIN.       2. Surgical [P], duodenum :       - DUODENAL MUCOSA WITH INCREASED INTRAEPITHELIAL LYMPHOCYTES AND MILD TO      MODERATE VILLOUS BLUNTING (SEE COMMENT).       3. Surgical [P], colon nos, random sites :       - BENIGN COLONIC MUCOSA WITH NO SPECIFIC PATHOLOGIC CHANGE.       4. Surgical [P], colon, ascending, polyp (4) :       - FRAGMENTS OF SESSILE SERRATED POLYP WITHOUT CYTOLOGIC DYSPLASIA.       Diagnosis Note  : Sections of the duodenum demonstrate small bowel mucosa with      increased intraepithelial lymphocytes, mild to moderate villous blunting,      expanded lamina propria, and scattered neutrophilic inflammation.The      differential diagnosis includes celiac disease but given the other inflammatory      features, a diagnosis of chronic duodenitis is favored.Clinical and serologic      correlation is  recommended.      ELECTRONIC SIGNATURE : Coronel Md, Misti, Sports Administrator, International Aid/development Worker  MICROSCOPIC DESCRIPTION  CASE COMMENTS STAINS USED IN DIAGNOSIS: H&E H&E-2 H&E H&E H&E-2 H&E H&E-2    CLINICAL HISTORY  SPECIMEN(S) OBTAINED 1. Surgical [P], Gastric 2. Surgical [P], Duodenum 3. Surgical [P], Colon Nos, Random Sites 4. Surgical [P], Colon, Ascending, Polyp (4)  SPECIMEN COMMENTS: 1. Iron deficiency anemia, unspecified iron deficiency anemia type; diarrhea, unspecified type; benign neoplasm of ascending colon SPECIMEN CLINICAL INFORMATION: 1 . R/O H.pylori 2. R/O celiac sprue 3. R/O microscopic colitis 4. R/O adenoma    Gross Description 1. Received in formalin are tan, soft tissue fragments that are submitted in toto.Number: multiple, Size: 0.2 cm smallest to 0.7 cm largest, (1B) ( TA ) 2. Received in formalin are tan, soft tissue fragments that are submitted in toto.Number: multiple, Size: 0.2 cm smallest to 0.4 cm largest, (1B) ( TA ) 3. Received in formalin are tan, soft tissue fragments that are submitted in toto.Number: multiple, Size: 0.2 cm smallest to 0.5 cm largest, (1B) ( TA ) 4. Received in formalin are tan, soft tissue fragments that are submitted in toto.Number: 4, Size: 0.3 cm smallest to 0.6 cm  largest, (1B) ( TA )        Report signed out from the following location(s) [place] [address] [place], [place] [address] CLIA #: [id number]  [facility] [address] [place], [place] [address] CLIA #: [id number]   Specimen status report     Status: None   Collection Time: 05/31/24 10:03 AM  Result Value Ref Range   specimen status report Comment     Comment: Ambiguous Test Order Ambiguous Test Order Test: [id number]-*Effective May 08, 2024, 832917 Celiac HLA DQ**                       Association will be discontinued. [facility] offers [phone]                       Celiac HLA DQ  Association.   Celiac Disease HLA DQ Assoc.     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  Celiac disease risk from the HLA DQA/DQB genotype is approximately 1:89 (1.1%) HLA allele interpretation for all loci based on IMGT/HLA database version 3.58.0 This test was developed and its performance characteristics determined by [facility].  It has not been cleared or approved by the Food and Drug Administration. The FDA has determined that such clearance or approval is not necessary. HLA Lab CLIA ID Number [id number] HISTOCOMPATIBILITY SECTION DIRECTOR: Camellia Griffon, PhD, D(ABMLI), F(ACHI) Greater than 95% of celiac patients are positive for either DQ2 or DQ8 ([place] and [place], (1993)  Gastroenterology [phone]). However these antigens may also be present in patients who do not have Celiac disease.    HLA NGS Methodology Comment     Comment: HLA results were obtained using Next Generation Sequencing (NGS). Supplemental procedures based on sequence based typing (SBT) and/or sequence specific oligonucleotide probes (SSOP) may be used as needed to obtain the required resolution. If you have questions, please call HLA customer service at [phone] or email at [email or web address] .[email or web address].   Specimen status report     Status: None   Collection Time: 05/31/24 10:03 AM  Result Value Ref Range   specimen status report Comment     Comment: Written Authorization Written Authorization Written Authorization Received. Authorization received from ORIGINAL REQ 06-05-2024 Logged by Joen Nose   Specimen status report     Status: None   Collection Time: 05/31/24 10:03 AM  Result Value Ref Range   specimen status report Comment     Comment: No Test Indicated A lavender  top tube was received with no test indicated A serum separator tube was received with no test indicated Dear Doctor, The requisition we received for the above patient has no test indicated on the request form for one or more of the specimens submitted.  The [place]  Code of Federal Regulations requires a written and signed request be forwarded to the testing laboratory following the verbal order of a laboratory test. Date:_________________________________ ICD-9/10 Diagnosis Code(s):___________________________________________ Physician or Authorized Designee Signature: ______________________________________________________________________ Your signature confirms your order of the test(s) listed Required test name(s):________________________________________________ Required test number(s):______________________________________________ Please provide requested information and fax to [phone] to expedite testing.   CBC with Differential (Cancer Center Only)     Status: Abnormal   Collection Time: 06/30/24  2:59 PM  Result Value Ref Range   WBC Count 3.8 (L) 4.0 - 10.5 K/uL   RBC 4.79 4.22 - 5.81 MIL/uL   Hemoglobin 13.4 13.0 - 17.0 g/dL   HCT 58.2 60.9 - 47.9 %   MCV 87.1 80.0 - 100.0 fL   MCH 28.0 26.0 - 34.0 pg   MCHC 32.1 30.0 - 36.0 g/dL   RDW 82.8 (H) 88.4 - 84.4 %   Platelet Count 145 (L) 150 - 400 K/uL    Comment: PLATELET COUNT CONFIRMED BY SMEAR   nRBC 0.0 0.0 - 0.2 %   Neutrophils Relative % 58 %   Neutro Abs 2.2 1.7 - 7.7 K/uL   Lymphocytes Relative 22 %   Lymphs Abs 0.8 0.7 - 4.0 K/uL   Monocytes Relative 11 %   Monocytes Absolute 0.4 0.1 - 1.0 K/uL   Eosinophils Relative 8 %   Eosinophils Absolute 0.3 0.0 - 0.5 K/uL   Basophils Relative 1 %   Basophils Absolute 0.0 0.0 - 0.1 K/uL   WBC Morphology MORPHOLOGY UNREMARKABLE    Smear Review See Note     Comment: Large Platelets Present   Immature Granulocytes 0 %   Abs Immature Granulocytes 0.00 0.00 -  0.07 K/uL   Ovalocytes PRESENT     Comment: Performed at [facility], [address], [place], [place] [address]  CMP (Cancer Center only)     Status: Abnormal   Collection Time: 06/30/24  2:59 PM  Result Value Ref Range   Sodium 139 135 - 145 mmol/L   Potassium 4.4 3.5 - 5.1 mmol/L   Chloride 104 98 - 111 mmol/L   CO2 32 22 - 32 mmol/L    Comment: (NOTE) Elevated LDH levels may cause falsely increased CO2 results. If LDH is >2000 U/L, a positive bias of 12% is possible.     Glucose, Bld 75 70 - 99 mg/dL    Comment: Glucose reference range applies only to samples taken after fasting for at least 8 hours.   BUN 19 8 - 23 mg/dL   Creatinine 8.90 9.38 - 1.24 mg/dL   Calcium 9.2 8.9 - 89.6 mg/dL   Total Protein 6.7 6.5 - 8.1 g/dL   Albumin 4.3 3.5 - 5.0 g/dL   AST 20 15 - 41 U/L   ALT 12 0 - 44 U/L   Alkaline Phosphatase 60 38 - 126 U/L   Total Bilirubin 0.4 0.0 - 1.2 mg/dL   GFR, Estimated >39 >39 mL/min    Comment: (NOTE) Calculated using the CKD-EPI Creatinine Equation (2021)    Anion gap 3 (L) 5 - 15    Comment: Performed at [facility], [address], [place], [place] [address]  Iron and Iron Binding Capacity ([facility] only)     Status: Abnormal   Collection Time: 06/30/24  2:59 PM  Result Value Ref Range   Iron 216 (H) 45 - 182 ug/dL   TIBC 590 749 - 549 ug/dL   Saturation Ratios 53 (H) 17.9 - 39.5 %   UIBC 193 117 - 376 ug/dL    Comment: Performed at [facility], [address], [place], [place] [address]  Ferritin     Status: None   Collection Time: 06/30/24  2:59 PM  Result Value Ref Range   Ferritin 26 24 - 336 ng/mL    Comment: Performed at [facility], [address], [place], [place] [address]  Vitamin  B12     Status: Abnormal   Collection Time: 06/30/24  2:59 PM  Result Value Ref Range   Vitamin B-12 1,224 (H) 180 - 914 pg/mL    Comment: Performed at [facility], [address], [place], [place] [address]  Folate     Status: Abnormal   Collection Time: 06/30/24  3:00 PM  Result Value Ref Range   Folate 5.9 (L) >5.9 ng/mL    Comment: Performed at [facility], [address] [place], [place] [address]  TSH     Status: None   Collection Time: 06/30/24  3:00 PM  Result Value Ref Range   TSH 2.390 0.350 - 4.500 uIU/mL    Comment: Performed at [facility], [address], [place], [place] [address]     RADIOGRAPHIC STUDIES:  No recent pertinent imaging studies available to review.  Orders Placed This Encounter  Procedures   CBC with Differential (Cancer Center Only)    Standing Status:   Future    Expiration Date:   07/07/2025   Folate    Standing Status:   Future    Expiration Date:   07/07/2025   Iron and Iron Binding Capacity ([facility] only)    Standing Status:   Future    Expiration Date:   07/07/2025   Ferritin    Standing Status:   Future    Expiration Date:   07/07/2025     Future Appointments  Date Time Provider Department Center  07/17/2024  3:45 PM Knox Leita CROME, RD [facility] NDM  10/27/2024  2:15 PM [facility] None  10/27/2024  2:45 PM Troy Kanouse, Chinita, MD [facility] None    This document was completed utilizing speech recognition software. Grammatical errors, random word insertions, pronoun errors, and incomplete sentences are an occasional consequence of this system due to software limitations, ambient noise, and hardware issues. Any formal questions or concerns about the content, text or information contained within the body of this dictation should be directly addressed to the provider for clarification.

## 2024-07-07 NOTE — Progress Notes (Signed)
 [facility]  HEMATOLOGY CLINIC CONSULTATION NOTE   PATIENT NAME: Cody Knapp   MR#: [id number] DOB: [birth year]  DATE OF SERVICE: 06/30/2024  REFERRING PHYSICIAN  Lupita Commander, MD  Patient Care Team: Merna Huxley, NP as PCP - General (Family Medicine) Commander Lupita BRAVO, MD as Consulting Physician (Gastroenterology)   REASON FOR CONSULTATION/ CHIEF COMPLAINT:  Evaluation of mild thrombocytopenia and borderline splenomegaly  ASSESSMENT & PLAN:  Cody Knapp is a 63 y.o. gentleman with a past medical history of rosacea, suspected celiac disease, was referred to our service for evaluation of thrombocytopenia.  Thrombocytopenia Fluctuating platelet counts with mild thrombocytopenia.  Most recent platelet count was 148,000 on 04/27/2024.  On review of records, he has had chronic thrombocytopenia dating back to at least December 2007, with platelet count as low as 102,000 in September 2010.  Platelet count today is 145,000.  Overall stable.  Ultrasound abdomen in September 2025 showed top normal/mildly increased splenic volume with spleen size measuring 11.9 x 11.6 x 5.5 cm.  Practically normal-sized.  Will pursue additional workup for this slightly low platelet count including iron studies, B12, folic acid, TSH.  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  No intervention would be needed unless platelet count is consistently below 30,000.  I will discuss results of workup over the phone next week.  Iron deficiency anemia due to celiac disease +/- some blood donation Likely secondary to frequent blood donations and also from celiac disease. Hemoglobin was slightly low at 11.5 g/dL in September 7974.   Iron supplementation has been inconsistent due to gastrointestinal side effects, but adherence has improved in the last 6-8 weeks. Iron deficiency may contribute to low platelet count.  Labs today actually showed normal hemoglobin of 13.4, MCV 81.1.  Iron studies  currently show no evidence of iron deficiency.  Ferritin on the lower side at 26.  - Continue daily iron supplementation - Consider IV iron infusion if oral supplementation is insufficient - Take iron with vitamin C or orange juice to enhance absorption  Celiac disease Suspected celiac disease with strong positive IgA anti-gliadin antibody (41) and weak positive tissue transglutaminase antibody (6). Endoscopy showed increased lymphocytes and mild to moderate villous blunting in the duodenum. Symptoms include diarrhea and abdominal rumbling, which have improved with a gluten-free diet. Differential diagnosis includes other inflammatory conditions, but celiac disease is suspected to be a component. - Continue gluten-free diet - Will follow up with gastroenterologist in six months   I reviewed lab results and outside records for this visit and discussed relevant results with the patient. Diagnosis, plan of care and treatment options were also discussed in detail with the patient. Opportunity provided to ask questions and answers provided to his apparent satisfaction. Provided instructions to call our clinic with any problems, questions or concerns prior to return visit. I recommended to continue follow-up with PCP and sub-specialists. He verbalized understanding and agreed with the plan. No barriers to learning was detected.  Cody Patten, MD 06/30/2024 [facility] - A DEPT OF [facility] [address] [place] [address] Dept: [phone] Dept Fax: [phone]  HISTORY OF PRESENTING ILLNESS:   Discussed the use of AI scribe software for clinical note transcription with the patient, who gave verbal consent to proceed.  History of Present Illness He was referred by Dr. Commander for evaluation of mild thrombocytopenia and intermittent anemia.  He experienced a sudden onset of  diarrhea starting on September 1st, lasting for  two weeks. There were no prior issues with gluten, bloating, pain, nausea, headaches, fever, or cramping. No blood in stools, though dark stools are noted due to iron supplementation.  He has undergone extensive workup including endoscopy, colonoscopy, and abdominal ultrasound. The ultrasound showed a spleen size of 11.9 cm. His platelet counts have fluctuated over the years, with a history of lower counts in 2007 and 2010. His most recent platelet count was 148,000.  On review of records, he has had chronic thrombocytopenia dating back to at least December 2007, with platelet count as low as 102,000 in September 2010.  He has a history of low iron levels and has donated blood in the past. He has been taking iron supplements more consistently over the past six to eight weeks. His hemoglobin levels have been low, leading to occasional rejection from blood donation due to low hemoglobin.  He has been evaluated for celiac disease, with a positive IgA anti-gliadin antibody test and mild to moderate villous blunting observed on endoscopy. He has been adhering to a gluten-free diet. He has a long-standing history of non-painful stomach rumbling, which has decreased in frequency since starting the diet.  No history of acid reflux or steroid use. He consumes alcohol occasionally and socially.    MEDICAL HISTORY Past Medical History:  Diagnosis Date   Allergy    Celiac disease 05/31/2024   Iron deficiency anemia due to celiac disease +/- some blood donation 05/31/2024   Personal history of colonic adenoma 08/10/2013   Rosacea    Thrombocytopenia 06/18/2010   Annotation: chronic--unchanged  Qualifier: Diagnosis of   By: Tammie MD, Bruce           SURGICAL HISTORY Past Surgical History:  Procedure Laterality Date   COLONOSCOPY     ESOPHAGOGASTRODUODENOSCOPY     KNEE ARTHROSCOPY Right      SOCIAL HISTORY: He reports that he has never smoked. He has never used smokeless tobacco. He reports  current alcohol use. He reports that he does not use drugs. Social History   Socioeconomic History   Marital status: Married    Spouse name: Not on file   Number of children: Not on file   Years of education: Not on file   Highest education level: Bachelor's degree (e.g., BA, AB, BS)  Occupational History   Not on file  Tobacco Use   Smoking status: Never   Smokeless tobacco: Never  Vaping Use   Vaping status: Not on file  Substance and Sexual Activity   Alcohol use: Yes    Comment: social   Drug use: Never   Sexual activity: Not on file  Other Topics Concern   Not on file  Social History Narrative   Works in Designer, Fashion/clothing    Married    Three daughters ( 23, 19,16)    Social Drivers of Health   Financial Resource Strain: Low Risk  (12/15/2021)   Overall Financial Resource Strain (CARDIA)    Difficulty of Paying Living Expenses: Not hard at all  Food Insecurity: No Food Insecurity (06/29/2024)   Hunger Vital Sign    Worried About Running Out of Food in the Last Year: Never true    Ran Out of Food in the Last Year: Never true  Transportation Needs: No Transportation Needs (06/29/2024)   PRAPARE - Administrator, Civil Service (Medical): No    Lack of Transportation (Non-Medical): No  Physical Activity: Sufficiently Active (12/15/2021)  Exercise Vital Sign    Days of Exercise per Week: 4 days    Minutes of Exercise per Session: 60 min  Stress: Stress Concern Present (12/15/2021)   [employer] of Occupational Health - Occupational Stress Questionnaire    Feeling of Stress : Very much  Social Connections: Socially Integrated (12/15/2021)   Social Connection and Isolation Panel    Frequency of Communication with Friends and Family: More than three times a week    Frequency of Social Gatherings with Friends and Family: Once a week    Attends Religious Services: 1 to 4 times per year    Active Member of [employer] or Organizations: Yes    Attends Banker  Meetings: 1 to 4 times per year    Marital Status: Married  Catering Manager Violence: Not on file    FAMILY HISTORY: His family history includes Colon polyps in his sister; Heart disease in his father.  CURRENT MEDICATIONS   Current Outpatient Medications  Medication Instructions   Betamethasone  Valerate 0.12 % foam 1 Application, 2 times daily   Cyanocobalamin  (VITAMIN B-12 PO) 2,500 mcg, Daily   Doxylamine Succinate, Sleep, (SLEEP AID PO) Take by mouth.   ferrous sulfate 325 mg, Daily   sildenafil  (VIAGRA ) 100 mg, Oral, Daily PRN   triamcinolone  cream (KENALOG ) 0.1 % APPLY TO AFFECTED AREA TWICE A DAY   zolpidem  (AMBIEN ) 10 mg, Oral, At bedtime PRN, for sleep     ALLERGIES  He has no known allergies.  REVIEW OF SYSTEMS:  Review of Systems - Oncology   Rest of the pertinent review of systems is unremarkable except as mentioned above in HPI.  PHYSICAL EXAMINATION:   Vitals:   06/30/24 1415  BP: 114/74  Pulse: 79  Resp: 20  Temp: (!) 97.2 F (36.2 C)  SpO2: 98%   Filed Weights   06/30/24 1415  Weight: 173 lb 6.4 oz (78.7 kg)    Physical Exam Constitutional:      General: He is not in acute distress.    Appearance: Normal appearance.  HENT:     Head: Normocephalic and atraumatic.  Eyes:     Conjunctiva/sclera: Conjunctivae normal.  Cardiovascular:     Rate and Rhythm: Normal rate and regular rhythm.  Pulmonary:     Effort: Pulmonary effort is normal. No respiratory distress.  Abdominal:     General: There is no distension.  Neurological:     General: No focal deficit present.     Mental Status: He is alert and oriented to person, place, and time.  Psychiatric:        Mood and Affect: Mood normal.        Behavior: Behavior normal.       LABORATORY DATA:   I have reviewed the data as listed.  Results for orders placed or performed in visit on 06/30/24  CBC with Differential (Cancer Center Only)  Result Value Ref Range   WBC Count 3.8 (L) 4.0  - 10.5 K/uL   RBC 4.79 4.22 - 5.81 MIL/uL   Hemoglobin 13.4 13.0 - 17.0 g/dL   HCT 58.2 60.9 - 47.9 %   MCV 87.1 80.0 - 100.0 fL   MCH 28.0 26.0 - 34.0 pg   MCHC 32.1 30.0 - 36.0 g/dL   RDW 82.8 (H) 88.4 - 84.4 %   Platelet Count 145 (L) 150 - 400 K/uL   nRBC 0.0 0.0 - 0.2 %   Neutrophils Relative % 58 %   Neutro Abs 2.2  1.7 - 7.7 K/uL   Lymphocytes Relative 22 %   Lymphs Abs 0.8 0.7 - 4.0 K/uL   Monocytes Relative 11 %   Monocytes Absolute 0.4 0.1 - 1.0 K/uL   Eosinophils Relative 8 %   Eosinophils Absolute 0.3 0.0 - 0.5 K/uL   Basophils Relative 1 %   Basophils Absolute 0.0 0.0 - 0.1 K/uL   WBC Morphology MORPHOLOGY UNREMARKABLE    Smear Review See Note    Immature Granulocytes 0 %   Abs Immature Granulocytes 0.00 0.00 - 0.07 K/uL   Ovalocytes PRESENT   CMP (Cancer Center only)  Result Value Ref Range   Sodium 139 135 - 145 mmol/L   Potassium 4.4 3.5 - 5.1 mmol/L   Chloride 104 98 - 111 mmol/L   CO2 32 22 - 32 mmol/L   Glucose, Bld 75 70 - 99 mg/dL   BUN 19 8 - 23 mg/dL   Creatinine 8.90 9.38 - 1.24 mg/dL   Calcium 9.2 8.9 - 89.6 mg/dL   Total Protein 6.7 6.5 - 8.1 g/dL   Albumin 4.3 3.5 - 5.0 g/dL   AST 20 15 - 41 U/L   ALT 12 0 - 44 U/L   Alkaline Phosphatase 60 38 - 126 U/L   Total Bilirubin 0.4 0.0 - 1.2 mg/dL   GFR, Estimated >39 >39 mL/min   Anion gap 3 (L) 5 - 15  Iron and Iron Binding Capacity ([facility] only)  Result Value Ref Range   Iron 216 (H) 45 - 182 ug/dL   TIBC 590 749 - 549 ug/dL   Saturation Ratios 53 (H) 17.9 - 39.5 %   UIBC 193 117 - 376 ug/dL  Ferritin  Result Value Ref Range   Ferritin 26 24 - 336 ng/mL  Vitamin B12  Result Value Ref Range   Vitamin B-12 1,224 (H) 180 - 914 pg/mL  Folate  Result Value Ref Range   Folate 5.9 (L) >5.9 ng/mL  TSH  Result Value Ref Range   TSH 2.390 0.350 - 4.500 uIU/mL     RADIOGRAPHIC STUDIES:  US  Abdomen Complete CLINICAL DATA:  Thrombocytopenia.  EXAM: ABDOMEN ULTRASOUND  COMPLETE  COMPARISON:  None Available.  FINDINGS: Gallbladder: No gallstones or wall thickening visualized. No sonographic Murphy sign noted by sonographer.  Common bile duct: Diameter: 2 mm  Liver: Very mildly increased parenchymal echogenicity of the liver may be within normal limits. Slight degree of steatosis cannot be excluded by ultrasound. No hepatic masses or intrahepatic biliary ductal dilatation identified. Portal vein is patent on color Doppler imaging with normal direction of blood flow towards the liver.  IVC: No abnormality visualized.  Pancreas: Visualized portion unremarkable.  Spleen: Splenic dimensions were estimated at 11.9 x 11.6 x 5.5 cm for estimated volume of 395 mL. Volume is considered top-normal/mildly increased for an adult male.  Right Kidney: Length: 10.6. Echogenicity within normal limits. No mass or hydronephrosis visualized.  Left Kidney: Length: 9.5. Echogenicity within normal limits. No mass or hydronephrosis visualized.  Abdominal aorta: No aneurysm visualized.  Other findings: No ascites.  IMPRESSION: 1. Top-normal/mildly increased splenic volume for adult male. 2. Very mildly increased parenchymal echogenicity of the liver may be within normal limits. Slight degree of steatosis cannot be excluded by ultrasound.  Electronically Signed   By: Marcey Moan M.D.   On: 05/13/2024 11:29   Orders Placed This Encounter  Procedures   CBC with Differential (Cancer Center Only)    Standing Status:   Future  Number of Occurrences:   1    Expiration Date:   06/30/2025   CMP (Cancer Center only)    Standing Status:   Future    Number of Occurrences:   1    Expiration Date:   06/30/2025   Iron and Iron Binding Capacity ([facility] only)    Standing Status:   Future    Number of Occurrences:   1    Expiration Date:   06/30/2025   Ferritin    Standing Status:   Future    Number of Occurrences:   1    Expiration Date:   06/30/2025    Vitamin B12    Standing Status:   Future    Number of Occurrences:   1    Expiration Date:   06/30/2025   Folate    Standing Status:   Future    Number of Occurrences:   1    Expiration Date:   06/30/2025   TSH    Standing Status:   Future    Number of Occurrences:   1    Expiration Date:   06/30/2025    Future Appointments  Date Time Provider Department Center  07/17/2024  3:45 PM Knox Leita CROME, RD [facility] NDM  10/27/2024  2:15 PM [facility] None  10/27/2024  2:45 PM Manpreet Strey, MD [facility] None    I spent a total of 55 minutes during this encounter with the patient including review of chart and various tests results, discussions about plan of care and coordination of care plan.  This document was completed utilizing speech recognition software. Grammatical errors, random word insertions, pronoun errors, and incomplete sentences are an occasional consequence of this system due to software limitations, ambient noise, and hardware issues. Any formal questions or concerns about the content, text or information contained within the body of this dictation should be directly addressed to the provider for clarification.

## 2024-07-07 NOTE — Assessment & Plan Note (Addendum)
 Folic acid level slightly below normal at 5.9. Low folic acid can cause cytopenias. No previous folic acid levels in the system. Discussed dietary sources of folic acid and the option of B complex supplementation. - Start B complex supplementation with at least 0.5 mg of folic acid - Will check folic acid levels at next appointment

## 2024-07-07 NOTE — Assessment & Plan Note (Signed)
 Fluctuating platelet counts with mild thrombocytopenia.  Most recent platelet count was 148,000 on 04/27/2024.  On review of records, he has had chronic thrombocytopenia dating back to at least December 2007, with platelet count as low as 102,000 in September 2010.  Platelet count today is 145,000.  Overall stable.  Ultrasound abdomen in September 2025 showed top normal/mildly increased splenic volume with spleen size measuring 11.9 x 11.6 x 5.5 cm.  Practically normal-sized.  Will pursue additional workup for this slightly low platelet count including iron studies, B12, folic acid, TSH.  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  No intervention would be needed unless platelet count is consistently below 30,000.  I will discuss results of workup over the phone next week.

## 2024-07-07 NOTE — Assessment & Plan Note (Addendum)
 Fluctuating platelet counts with mild thrombocytopenia.  Most recent platelet count was 148,000 on 04/27/2024.  On review of records, he has had chronic thrombocytopenia dating back to at least December 2007, with platelet count as low as 102,000 in September 2010.  On his consultation with us  on 06/30/2024, labs showed platelet count of 145,000.  Overall stable.  Hemoglobin was low normal at 13.4. Iron studies, B12, TSH were within normal limits. Folic acid was decreased at 5.9.   Ultrasound abdomen in September 2025 showed top normal/mildly increased splenic volume with spleen size measuring 11.9 x 11.6 x 5.5 cm.  Practically normal-sized.    Mild folic acid deficiency could be contributing to thrombocytopenia.  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  No intervention would be needed unless platelet count is consistently below 30,000.   He was started on folic acid supplementation from 07/07/2024.  He was advised to continue B12 supplementation and oral iron supplementation along with vitamin C to enhance absorption.

## 2024-07-17 ENCOUNTER — Ambulatory Visit: Admitting: Dietician

## 2024-09-19 ENCOUNTER — Telehealth: Payer: Self-pay | Admitting: Oncology

## 2024-09-19 NOTE — Telephone Encounter (Signed)
 I spoke with patient as he called in to reschedule 10/27/2024 lab and MD appointments to 10/20/2024.

## 2024-09-20 ENCOUNTER — Ambulatory Visit: Admitting: Adult Health

## 2024-09-20 ENCOUNTER — Encounter: Payer: Self-pay | Admitting: Adult Health

## 2024-09-20 ENCOUNTER — Encounter: Admitting: Adult Health

## 2024-09-20 VITALS — BP 120/70 | HR 61 | Temp 97.9°F | Ht 70.0 in | Wt 175.0 lb

## 2024-09-20 DIAGNOSIS — Z23 Encounter for immunization: Secondary | ICD-10-CM

## 2024-09-20 DIAGNOSIS — Z0000 Encounter for general adult medical examination without abnormal findings: Secondary | ICD-10-CM

## 2024-09-20 DIAGNOSIS — F5101 Primary insomnia: Secondary | ICD-10-CM

## 2024-09-20 DIAGNOSIS — L719 Rosacea, unspecified: Secondary | ICD-10-CM

## 2024-09-20 DIAGNOSIS — Z125 Encounter for screening for malignant neoplasm of prostate: Secondary | ICD-10-CM | POA: Diagnosis not present

## 2024-09-20 DIAGNOSIS — N529 Male erectile dysfunction, unspecified: Secondary | ICD-10-CM | POA: Diagnosis not present

## 2024-09-20 DIAGNOSIS — D696 Thrombocytopenia, unspecified: Secondary | ICD-10-CM

## 2024-09-20 DIAGNOSIS — J301 Allergic rhinitis due to pollen: Secondary | ICD-10-CM

## 2024-09-20 MED ORDER — SILDENAFIL CITRATE 100 MG PO TABS: 100.0000 mg | ORAL_TABLET | Freq: Every day | ORAL | 6 refills | Status: AC | PRN

## 2024-09-20 NOTE — Patient Instructions (Signed)
 It was great seeing you today   We will follow up with you regarding your lab work   Please let me know if you need anything

## 2024-09-20 NOTE — Progress Notes (Signed)
 "  Subjective:    Patient ID: Cody Knapp, male    DOB: 02/22/1961, 64 y.o.   MRN: [id number]  HPI Patient presents for yearly preventative medicine examination. He is a pleasant 64 year old male who  has a past medical history of Allergy, Celiac disease (05/31/2024), Iron deficiency anemia due to celiac disease +/- some blood donation (05/31/2024), Personal history of colonic adenoma (08/10/2013), Rosacea, and Thrombocytopenia (06/18/2010).  Seasonal Allergies - Takes Allegra  180 mg PRN   Insomnia - takes Ambien  10 mg daily - managed by pulmonary   Rosacea - is followed by Dermatology. He is not currently on any treatment   ED - uses viagra  PRN   Thrombocytopenia - has been seen by hematology. He has mild thrombocytopenia. He was placed on Folic Acid  by Hematology and has cut back on giving blood. Ultrasound abdomen in September 2025 showed top normal/mildly increased splenic volume with spleen size measuring 11.9 x 11.6 x 5.5 cm. Practically normal-sized.  Lab Results  Component Value Date   WBC 3.8 (L) 06/30/2024   HGB 13.4 06/30/2024   HCT 41.7 06/30/2024   MCV 87.1 06/30/2024   PLT 145 (L) 06/30/2024     All immunizations and health maintenance protocols were reviewed with the patient and needed orders were placed.  Appropriate screening laboratory values were ordered for the patient including screening of hyperlipidemia, renal function and hepatic function.  Medication reconciliation,  past medical history, social history, problem list and allergies were reviewed in detail with the patient  Goals were established with regard to weight loss, exercise, and  diet in compliance with medications Wt Readings from Last 3 Encounters:  09/20/24 175 lb (79.4 kg)  06/30/24 173 lb 6.4 oz (78.7 kg)  05/31/24 170 lb 6 oz (77.3 kg)    Review of Systems  Constitutional: Negative.   HENT: Negative.    Eyes: Negative.   Respiratory: Negative.    Cardiovascular: Negative.    Gastrointestinal: Negative.   Endocrine: Negative.   Genitourinary: Negative.   Musculoskeletal: Negative.   Skin: Negative.   Allergic/Immunologic: Negative.   Neurological: Negative.   Hematological: Negative.   Psychiatric/Behavioral: Negative.    All other systems reviewed and are negative.  Past Medical History:  Diagnosis Date   Allergy    Celiac disease 05/31/2024   Iron deficiency anemia due to celiac disease +/- some blood donation 05/31/2024   Personal history of colonic adenoma 08/10/2013   Rosacea    Thrombocytopenia 06/18/2010   Annotation: chronic--unchanged  Qualifier: Diagnosis of   By: Tammie MD, Bruce          Social History   Socioeconomic History   Marital status: Married    Spouse name: Not on file   Number of children: Not on file   Years of education: Not on file   Highest education level: Bachelor's degree (e.g., BA, AB, BS)  Occupational History   Not on file  Tobacco Use   Smoking status: Never   Smokeless tobacco: Never  Vaping Use   Vaping status: Not on file  Substance and Sexual Activity   Alcohol use: Yes    Comment: social   Drug use: Never   Sexual activity: Not on file  Other Topics Concern   Not on file  Social History Narrative   Works in Designer, Fashion/clothing    Married    Three daughters ( 23, 19,16)    Social Drivers of Health   Tobacco Use: Low Risk (09/20/2024)   Patient  History    Smoking Tobacco Use: Never    Smokeless Tobacco Use: Never    Passive Exposure: Not on file  Financial Resource Strain: Low Risk (12/15/2021)   Overall Financial Resource Strain (CARDIA)    Difficulty of Paying Living Expenses: Not hard at all  Food Insecurity: No Food Insecurity (06/29/2024)   Epic    Worried About Programme Researcher, Broadcasting/film/video in the Last Year: Never true    Ran Out of Food in the Last Year: Never true  Transportation Needs: No Transportation Needs (06/29/2024)   Epic    Lack of Transportation (Medical): No    Lack of Transportation  (Non-Medical): No  Physical Activity: Sufficiently Active (12/15/2021)   Exercise Vital Sign    Days of Exercise per Week: 4 days    Minutes of Exercise per Session: 60 min  Stress: Stress Concern Present (12/15/2021)   [employer] of Occupational Health - Occupational Stress Questionnaire    Feeling of Stress : Very much  Social Connections: Socially Integrated (12/15/2021)   Social Connection and Isolation Panel    Frequency of Communication with Friends and Family: More than three times a week    Frequency of Social Gatherings with Friends and Family: Once a week    Attends Religious Services: 1 to 4 times per year    Active Member of [employer] or Organizations: Yes    Attends Banker Meetings: 1 to 4 times per year    Marital Status: Married  Catering Manager Violence: Not on file  Depression (PHQ2-9): Low Risk (06/30/2024)   Depression (PHQ2-9)    PHQ-2 Score: 0  Alcohol Screen: Low Risk (12/15/2021)   Alcohol Screen    Last Alcohol Screening Score (AUDIT): 4  Housing: Low Risk (06/29/2024)   Epic    Unable to Pay for Housing in the Last Year: No    Number of Times Moved in the Last Year: 0    Homeless in the Last Year: No  Utilities: Not At Risk (06/29/2024)   Epic    Threatened with loss of utilities: No  Health Literacy: Not on file    Past Surgical History:  Procedure Laterality Date   COLONOSCOPY     ESOPHAGOGASTRODUODENOSCOPY     KNEE ARTHROSCOPY Right     Family History  Problem Relation Age of Onset   Heart disease Father        aortic vavle disease (replaced)   Colon polyps Sister    Colon cancer Neg Hx    Pancreatic cancer Neg Hx    Esophageal cancer Neg Hx    Rectal cancer Neg Hx    Stomach cancer Neg Hx     Allergies[1]  Medications Ordered Prior to Encounter[2]  BP 120/70   Pulse 61   Temp 97.9 F (36.6 C) (Oral)   Ht 5' 10 (1.778 m)   Wt 175 lb (79.4 kg)   SpO2 98%   BMI 25.11 kg/m       Objective:   Physical  Exam Vitals and nursing note reviewed.  Constitutional:      General: He is not in acute distress.    Appearance: Normal appearance. He is not ill-appearing.  HENT:     Head: Normocephalic and atraumatic.     Right Ear: Tympanic membrane, ear canal and external ear normal. There is no impacted cerumen.     Left Ear: Tympanic membrane, ear canal and external ear normal. There is no impacted cerumen.     Nose:  Nose normal. No congestion or rhinorrhea.     Mouth/Throat:     Mouth: Mucous membranes are moist.     Pharynx: Oropharynx is clear.  Eyes:     Extraocular Movements: Extraocular movements intact.     Conjunctiva/sclera: Conjunctivae normal.     Pupils: Pupils are equal, round, and reactive to light.  Neck:     Vascular: No carotid bruit.  Cardiovascular:     Rate and Rhythm: Normal rate and regular rhythm.     Pulses: Normal pulses.     Heart sounds: No murmur heard.    No friction rub. No gallop.  Pulmonary:     Effort: Pulmonary effort is normal.     Breath sounds: Normal breath sounds.  Abdominal:     General: Abdomen is flat. Bowel sounds are normal. There is no distension.     Palpations: Abdomen is soft. There is no mass.     Tenderness: There is no abdominal tenderness. There is no guarding or rebound.     Hernia: No hernia is present.  Musculoskeletal:        General: Normal range of motion.     Cervical back: Normal range of motion and neck supple.  Lymphadenopathy:     Cervical: No cervical adenopathy.  Skin:    General: Skin is warm and dry.     Capillary Refill: Capillary refill takes less than 2 seconds.  Neurological:     General: No focal deficit present.     Mental Status: He is alert and oriented to person, place, and time.  Psychiatric:        Mood and Affect: Mood normal.        Behavior: Behavior normal.        Thought Content: Thought content normal.        Judgment: Judgment normal.         Assessment & Plan:  1. Routine general medical  examination at a health care facility (Primary) Today patient counseled on age appropriate routine health concerns for screening and prevention, each reviewed and up to date or declined. Immunizations reviewed and up to date or declined. Labs ordered and reviewed. Risk factors for depression reviewed and negative. Hearing function and visual acuity are intact. ADLs screened and addressed as needed. Functional ability and level of safety reviewed and appropriate. Education, counseling and referrals performed based on assessed risks today. Patient provided with a copy of personalized plan for preventive services. - eat healthy and exercise - Follow up in one year or sooner if needed  2. Seasonal allergic rhinitis due to pollen - Continue with current treatment   3. Primary insomnia - Continue with Ambien   - Lipid panel; Future - CBC; Future - Comprehensive metabolic panel with GFR; Future - Comprehensive metabolic panel with GFR - CBC - Lipid panel  4. Rosacea - Per dermatology  - Lipid panel; Future - CBC; Future - Comprehensive metabolic panel with GFR; Future - Comprehensive metabolic panel with GFR - CBC - Lipid panel  5. Prostate cancer screening  - PSA; Future - PSA  6. Erectile dysfunction, unspecified erectile dysfunction type - Continue Viagra   - Lipid panel; Future - CBC; Future - Comprehensive metabolic panel with GFR; Future - Comprehensive metabolic panel with GFR - CBC - Lipid panel - sildenafil  (VIAGRA ) 100 MG tablet; Take 1 tablet (100 mg total) by mouth daily as needed for erectile dysfunction.  Dispense: 10 tablet; Refill: 6  7. Thrombocytopenia - Stable - Lipid panel;  Future - CBC; Future - Comprehensive metabolic panel with GFR; Future - Comprehensive metabolic panel with GFR - CBC - Lipid panel  8. Need for pneumococcal vaccine  - Pneumococcal conjugate vaccine 20-valent (Prevnar 20)  Darleene Shape, NP      [1] No Known Allergies [2]   Current Outpatient Medications on File Prior to Visit  Medication Sig Dispense Refill   B Complex Vitamins (B COMPLEX 1 PO)      Betamethasone  Valerate 0.12 % foam Apply 1 Application topically 2 (two) times daily.     Cholecalciferol (VITAMIN D3) 50 MCG (2000 UT) capsule      Cyanocobalamin  (VITAMIN B-12 PO) Take 2,500 mcg by mouth daily.     Doxylamine Succinate, Sleep, (SLEEP AID PO) Take by mouth.     ferrous sulfate 325 (65 FE) MG tablet Take 325 mg by mouth daily.     fluticasone  (FLONASE ) 50 MCG/ACT nasal spray 1 spray in each nostril Once a day , Nasally     sildenafil  (VIAGRA ) 100 MG tablet Take 1 tablet (100 mg total) by mouth daily as needed for erectile dysfunction. 10 tablet 3   triamcinolone  cream (KENALOG ) 0.1 % APPLY TO AFFECTED AREA TWICE A DAY 80 g 0   zolpidem  (AMBIEN ) 10 MG tablet TAKE 1 TABLET BY MOUTH AT BEDTIME AS NEEDED FOR SLEEP 30 tablet 2   No current facility-administered medications on file prior to visit.   "

## 2024-09-21 ENCOUNTER — Ambulatory Visit: Payer: Self-pay | Admitting: Adult Health

## 2024-09-21 LAB — COMPREHENSIVE METABOLIC PANEL WITH GFR
ALT: 14 U/L (ref 3–53)
AST: 20 U/L (ref 5–37)
Albumin: 4.3 g/dL (ref 3.5–5.2)
Alkaline Phosphatase: 51 U/L (ref 39–117)
BUN: 18 mg/dL (ref 6–23)
CO2: 33 meq/L — ABNORMAL HIGH (ref 19–32)
Calcium: 9.6 mg/dL (ref 8.4–10.5)
Chloride: 101 meq/L (ref 96–112)
Creatinine, Ser: 1.11 mg/dL (ref 0.40–1.50)
GFR: 71.37 mL/min
Glucose, Bld: 85 mg/dL (ref 70–99)
Potassium: 4.4 meq/L (ref 3.5–5.1)
Sodium: 137 meq/L (ref 135–145)
Total Bilirubin: 0.5 mg/dL (ref 0.2–1.2)
Total Protein: 6.5 g/dL (ref 6.0–8.3)

## 2024-09-21 LAB — CBC
HCT: 44 % (ref 39.0–52.0)
Hemoglobin: 15.1 g/dL (ref 13.0–17.0)
MCHC: 34.2 g/dL (ref 30.0–36.0)
MCV: 92.6 fl (ref 78.0–100.0)
Platelets: 133 10*3/uL — ABNORMAL LOW (ref 150.0–400.0)
RBC: 4.75 Mil/uL (ref 4.22–5.81)
RDW: 16.4 % — ABNORMAL HIGH (ref 11.5–15.5)
WBC: 3.5 10*3/uL — ABNORMAL LOW (ref 4.0–10.5)

## 2024-09-21 LAB — LIPID PANEL
Cholesterol: 154 mg/dL (ref 28–200)
HDL: 45.1 mg/dL
LDL Cholesterol: 78 mg/dL (ref 10–99)
NonHDL: 108.5
Total CHOL/HDL Ratio: 3
Triglycerides: 154 mg/dL — ABNORMAL HIGH (ref 10.0–149.0)
VLDL: 30.8 mg/dL (ref 0.0–40.0)

## 2024-09-21 LAB — PSA: PSA: 1.14 ng/mL (ref 0.10–4.00)

## 2024-10-13 ENCOUNTER — Ambulatory Visit: Admitting: Gastroenterology

## 2024-10-19 ENCOUNTER — Inpatient Hospital Stay: Attending: Oncology

## 2024-10-19 ENCOUNTER — Inpatient Hospital Stay: Admitting: Oncology

## 2024-10-20 ENCOUNTER — Inpatient Hospital Stay: Admitting: Oncology

## 2024-10-20 ENCOUNTER — Inpatient Hospital Stay

## 2024-10-27 ENCOUNTER — Inpatient Hospital Stay

## 2024-10-27 ENCOUNTER — Inpatient Hospital Stay: Admitting: Oncology
